# Patient Record
Sex: Female | Born: 1980 | Race: Asian | Hispanic: No | Marital: Married | Smoking: Never smoker
Health system: Southern US, Community
[De-identification: ages and names within clinical notes are randomized; demographics above are authoritative.]

## PROBLEM LIST (undated history)

## (undated) ENCOUNTER — Inpatient Hospital Stay (HOSPITAL_COMMUNITY): Payer: Self-pay

## (undated) DIAGNOSIS — Z789 Other specified health status: Secondary | ICD-10-CM

## (undated) DIAGNOSIS — D649 Anemia, unspecified: Secondary | ICD-10-CM

## (undated) HISTORY — PX: BREAST SURGERY: SHX581

## (undated) HISTORY — DX: Anemia, unspecified: D64.9

---

## 2014-04-14 ENCOUNTER — Other Ambulatory Visit (HOSPITAL_COMMUNITY): Payer: Self-pay | Admitting: Physician Assistant

## 2014-04-14 DIAGNOSIS — Z3689 Encounter for other specified antenatal screening: Secondary | ICD-10-CM

## 2014-04-14 LAB — OB RESULTS CONSOLE HEPATITIS B SURFACE ANTIGEN: Hepatitis B Surface Ag: NEGATIVE

## 2014-04-14 LAB — OB RESULTS CONSOLE ABO/RH: RH Type: POSITIVE

## 2014-04-14 LAB — OB RESULTS CONSOLE GC/CHLAMYDIA
Chlamydia: NEGATIVE
GC PROBE AMP, GENITAL: NEGATIVE

## 2014-04-14 LAB — OB RESULTS CONSOLE RPR: RPR: NONREACTIVE

## 2014-04-14 LAB — OB RESULTS CONSOLE HIV ANTIBODY (ROUTINE TESTING): HIV: NONREACTIVE

## 2014-04-14 LAB — OB RESULTS CONSOLE RUBELLA ANTIBODY, IGM: Rubella: IMMUNE

## 2014-04-14 LAB — OB RESULTS CONSOLE ANTIBODY SCREEN: Antibody Screen: NEGATIVE

## 2014-05-05 ENCOUNTER — Encounter (HOSPITAL_COMMUNITY): Payer: Self-pay

## 2014-05-05 ENCOUNTER — Ambulatory Visit (HOSPITAL_COMMUNITY)
Admission: RE | Admit: 2014-05-05 | Discharge: 2014-05-05 | Disposition: A | Discharge status: Home / Self Care | Payer: Self-pay | Source: Ambulatory Visit | Attending: Physician Assistant | Admitting: Physician Assistant

## 2014-05-05 DIAGNOSIS — Z3A18 18 weeks gestation of pregnancy: Secondary | ICD-10-CM | POA: Insufficient documentation

## 2014-05-05 DIAGNOSIS — Z36 Encounter for antenatal screening of mother: Secondary | ICD-10-CM | POA: Insufficient documentation

## 2014-05-05 DIAGNOSIS — Z3689 Encounter for other specified antenatal screening: Secondary | ICD-10-CM | POA: Insufficient documentation

## 2014-05-27 NOTE — L&D Delivery Note (Signed)
Delivery Note Pushed well with crowning shortly thereafter.  At 1:52 PM a viable and healthy female was delivered via Vaginal, Spontaneous Delivery (Presentation: ; Occiput Anterior).  APGAR: , ; weight  .pending   Placenta status:  Delayed, delivered by Dr Debroah LoopArnold via manual extraction. , .  Cord: 3 vessels  with the following complications: Retained placenta.    Anesthesia: Epidural  Episiotomy: None Lacerations: 3rd degree Suture Repair: 3.0 vicryl  Repair done under epidural anesthesia by Dr Delanna AhmadiArnold Est. Blood Loss (mL):  1500  Stage 3 Hemorrhage protocol initiated.  Patient had one reading of hypotension and tachycardia, improved with fluids. Dr Debroah LoopArnold states we did not need to call team.  Will transfuse 2 units of blood, draw CBC and place her on oxygen. Second IV started. Fluids administered.   Filed Vitals:   09/23/14 1100 09/23/14 1130 09/23/14 1200 09/23/14 1230  BP: 129/85 123/71 130/80 127/73  Pulse: 87 78 74 76  Temp:      TempSrc:      Resp:      Height:      Weight:        Mom to postpartum.  Baby to Couplet care / Skin to Skin.  Wynelle BourgeoisWILLIAMS,Jaretzi Droz 09/23/2014, 2:39 PM

## 2014-05-31 ENCOUNTER — Encounter (HOSPITAL_COMMUNITY): Payer: Self-pay | Admitting: *Deleted

## 2014-05-31 ENCOUNTER — Inpatient Hospital Stay (HOSPITAL_COMMUNITY): Payer: PRIVATE HEALTH INSURANCE

## 2014-05-31 ENCOUNTER — Inpatient Hospital Stay (HOSPITAL_COMMUNITY)
Admission: AD | Admit: 2014-05-31 | Discharge: 2014-05-31 | Disposition: A | Discharge status: Home / Self Care | Payer: PRIVATE HEALTH INSURANCE | Source: Ambulatory Visit | Attending: Obstetrics & Gynecology | Admitting: Obstetrics & Gynecology

## 2014-05-31 DIAGNOSIS — O4690 Antepartum hemorrhage, unspecified, unspecified trimester: Secondary | ICD-10-CM | POA: Diagnosis present

## 2014-05-31 DIAGNOSIS — N841 Polyp of cervix uteri: Secondary | ICD-10-CM | POA: Diagnosis present

## 2014-05-31 DIAGNOSIS — Z3A22 22 weeks gestation of pregnancy: Secondary | ICD-10-CM

## 2014-05-31 DIAGNOSIS — O4692 Antepartum hemorrhage, unspecified, second trimester: Secondary | ICD-10-CM

## 2014-05-31 DIAGNOSIS — O209 Hemorrhage in early pregnancy, unspecified: Secondary | ICD-10-CM | POA: Insufficient documentation

## 2014-05-31 DIAGNOSIS — O3441 Maternal care for other abnormalities of cervix, first trimester: Secondary | ICD-10-CM | POA: Insufficient documentation

## 2014-05-31 HISTORY — DX: Other specified health status: Z78.9

## 2014-05-31 LAB — URINE MICROSCOPIC-ADD ON

## 2014-05-31 LAB — URINALYSIS, ROUTINE W REFLEX MICROSCOPIC
BILIRUBIN URINE: NEGATIVE
GLUCOSE, UA: NEGATIVE mg/dL
KETONES UR: NEGATIVE mg/dL
Nitrite: NEGATIVE
PH: 7 (ref 5.0–8.0)
PROTEIN: NEGATIVE mg/dL
Specific Gravity, Urine: 1.015 (ref 1.005–1.030)
Urobilinogen, UA: 0.2 mg/dL (ref 0.0–1.0)

## 2014-05-31 LAB — WET PREP, GENITAL
CLUE CELLS WET PREP: NONE SEEN
Trich, Wet Prep: NONE SEEN
YEAST WET PREP: NONE SEEN

## 2014-05-31 NOTE — MAU Provider Note (Signed)
Obstetric Attending MAU Note  Chief Complaint:  Vaginal Bleeding   First Provider Initiated Contact with Patient 05/31/14 1346     HPI: Susan Lawrence is a 34 y.o. G1P0 at 595w4d who presents to maternity admissions reporting  Spotting for the past 5 days and cramping. No prior bleeding. Had normal anatomy scan and normal placenta.  Accompanied by husband. No recent IC. Denies contractions, leakage of fluid. Good fetal movement.   [facility] Course[facility] no [facility] this [facility]  Past Medical History  Diagnosis Date  . Medical history non-contributory     OB History  Gravida Para Term Preterm AB SAB TAB Ectopic Multiple Living  1             # Outcome Date GA Lbr Len/2nd Weight Sex Delivery Anes PTL Lv  1 Current               Past Surgical History  Procedure Laterality Date  . Breast surgery      Family History: History reviewed. No pertinent family history.  Social History: History  Substance Use Topics  . Smoking status: Never Smoker   . Smokeless tobacco: Not on file  . Alcohol Use: No    Allergies: No Known Allergies  No prescriptions prior to admission    ROS: Pertinent findings in history of present illness.  Physical Exam  Blood pressure 110/59, pulse 82, temperature 98.3 F (36.8 C), temperature source Oral, resp. rate 18. GENERAL: Well-developed, well-nourished female in no acute distress.  HEENT: Normocephalic, atraumatic ABDOMEN: Soft, non-tender, gravid appropriate for gestational age EXTREMITIES: Nontender, no edema NEURO: Alert and oriented SPECULUM EXAM: NEFG, physiologic discharge, no blood. 6 cm x 7 mm long pink polyp noted coming from cervical os with long and narrow stalk, unable to ascertain level of origin of polyp. Polyp base noted to be friable and is around introitus with Valsalva.  Labs: Results for orders placed or performed during the hospital encounter of 05/31/14 (from the past 24 hour(s))    Urinalysis, Routine w reflex microscopic     Status: Abnormal   Collection Time: 05/31/14 11:28 AM  Result Value Ref Range   Color, Urine YELLOW YELLOW   APPearance CLEAR CLEAR   Specific Gravity, Urine 1.015 1.005 - 1.030   pH 7.0 5.0 - 8.0   Glucose, UA NEGATIVE NEGATIVE mg/dL   Hgb urine dipstick SMALL (A) NEGATIVE   Bilirubin Urine NEGATIVE NEGATIVE   Ketones, ur NEGATIVE NEGATIVE mg/dL   Protein, ur NEGATIVE NEGATIVE mg/dL   Urobilinogen, UA 0.2 0.0 - 1.0 mg/dL   Nitrite NEGATIVE NEGATIVE   Leukocytes, UA LARGE (A) NEGATIVE  Urine microscopic-add on     Status: Abnormal   Collection Time: 05/31/14 11:28 AM  Result Value Ref Range   Squamous Epithelial / LPF MANY (A) RARE   WBC, UA TOO NUMEROUS TO COUNT <3 WBC/hpf   RBC / HPF 3-6 <3 RBC/hpf   Bacteria, UA RARE RARE   Urine-Other MUCOUS PRESENT   Wet prep, genital     Status: Abnormal   Collection Time: 05/31/14  4:57 PM  Result Value Ref Range   Yeast Wet Prep HPF POC NONE SEEN NONE SEEN   Trich, Wet Prep NONE SEEN NONE SEEN   Clue Cells Wet Prep HPF POC NONE SEEN NONE SEEN   WBC, Wet Prep HPF POC FEW (A) NONE SEEN    Imaging:    MAU Course:   Assessment: 1. Polyp at cervical os  2. Vaginal bleeding in pregnancy, second trimester     Plan: Discharge home Patient told that spotting may continue due to polyp but bleeding precautions revewed in detail.  No indication for pelvic rest but intercourse can cause bleeding from the polyp. No indication for polypectomy at this GA; also has increased risk of bleeding. Preterm labor precautions and fetal kick counts reviewed Follow up with OB provider as scheduled on 06/09/14.     Medication List    ASK your doctor about these medications        PRENATAL/FOLIC ACID PO  Take 2 tablets by mouth daily.        Tereso Newcomer, MD 05/31/2014 6:28 PM

## 2014-05-31 NOTE — MAU Note (Signed)
Stared spotting 5 days ago, heavier today.  Bright red.  Cramping in lower abd started today.   No prior bleeding in  preg.

## 2014-05-31 NOTE — MAU Note (Signed)
Cervical length 3.6 per U/S tech.

## 2014-05-31 NOTE — Discharge Instructions (Signed)
Cervical polyp during pregnancy  Another common cause of spotting is a cervical polyp (a harmless growth on the cervix), which is more likely to bleed during pregnancy due to higher estrogen levels. This may occur because there are an increased number of blood vessels in the tissue around the cervix during pregnancy.  As a result, contact with this area (through sexual intercourse or a gynecological exam, for example) can cause bleeding.

## 2014-06-01 LAB — GC/CHLAMYDIA PROBE AMP
CT Probe RNA: NEGATIVE
GC Probe RNA: NEGATIVE

## 2014-09-05 LAB — OB RESULTS CONSOLE GC/CHLAMYDIA
CHLAMYDIA, DNA PROBE: NEGATIVE
Gonorrhea: NEGATIVE

## 2014-09-05 LAB — OB RESULTS CONSOLE GBS: GBS: NEGATIVE

## 2014-09-06 ENCOUNTER — Encounter (HOSPITAL_COMMUNITY): Payer: Self-pay | Admitting: *Deleted

## 2014-09-06 ENCOUNTER — Inpatient Hospital Stay (HOSPITAL_COMMUNITY)
Admission: EM | Admit: 2014-09-06 | Discharge: 2014-09-06 | Disposition: A | Discharge status: Home / Self Care | Payer: PRIVATE HEALTH INSURANCE | Source: Ambulatory Visit | Attending: Family Medicine | Admitting: Family Medicine

## 2014-09-06 DIAGNOSIS — M545 Low back pain: Secondary | ICD-10-CM | POA: Insufficient documentation

## 2014-09-06 DIAGNOSIS — O4703 False labor before 37 completed weeks of gestation, third trimester: Secondary | ICD-10-CM | POA: Diagnosis not present

## 2014-09-06 DIAGNOSIS — R109 Unspecified abdominal pain: Secondary | ICD-10-CM | POA: Insufficient documentation

## 2014-09-06 DIAGNOSIS — Z3A36 36 weeks gestation of pregnancy: Secondary | ICD-10-CM | POA: Diagnosis not present

## 2014-09-06 DIAGNOSIS — O479 False labor, unspecified: Secondary | ICD-10-CM

## 2014-09-06 LAB — GROUP B STREP BY PCR: Group B strep by PCR: NEGATIVE

## 2014-09-06 NOTE — MAU Note (Signed)
Pain started last night, getting stronger. No bleeding or leaking

## 2014-09-06 NOTE — MAU Provider Note (Signed)
History     CSN: [id number]  Arrival date and time: 09/06/14 1027   First Provider Initiated Contact with Patient 09/06/14 1123      Chief Complaint  Patient presents with  . Labor Eval   HPI   Patient is 34 y.o. G1P0 1261w4d here with complaints of low back pain/ abdominal pain. Patient reports that last night she was in so much pain in her back that she could not sleep.  She noticed that this morning the pain was worse and now wraps around underneath her belly.  Has used salonpas for back pain with no relief.  Has not taken any medications for the back pain.  Endorses loss of fluid though she unsure if urine vs ROM.  +FM, denies VB, vaginal discharge.  OB History    Gravida Para Term Preterm AB TAB SAB Ectopic Multiple Living   1               Past Medical History  Diagnosis Date  . Medical history non-contributory     Past Surgical History  Procedure Laterality Date  . Breast surgery      No family history on file.  History  Substance Use Topics  . Smoking status: Never Smoker   . Smokeless tobacco: Not on file  . Alcohol Use: No    Allergies: No Known Allergies  Prescriptions prior to admission  Medication Sig Dispense Refill Last Dose  . Prenatal Vit-Fe Fumarate-FA (PRENATAL/FOLIC ACID PO) Take 2 tablets by mouth daily.   09/06/2014 at Unknown time    ROS Physical Exam   Blood pressure 125/69, pulse 71, temperature 98 F (36.7 C), temperature source Oral, resp. rate 18, height 5\' 1"  (1.549 m), weight 138 lb (62.596 kg).  Physical Exam  Constitutional: She is oriented to person, place, and time. She appears well-developed and well-nourished. No distress.  HENT:  Head: Normocephalic and atraumatic.  Eyes: EOM are normal. No scleral icterus.  Neck: Normal range of motion. Neck supple.  Cardiovascular: Normal rate, regular rhythm, normal heart sounds and intact distal pulses.   No murmur heard. Respiratory: Effort normal and breath sounds normal. No  respiratory distress.  GI: Bowel sounds are normal. There is no tenderness.  Gravid  Musculoskeletal: Normal range of motion. She exhibits no edema.  Neurological: She is alert and oriented to person, place, and time.  Skin: Skin is warm and dry. No rash noted.  Psychiatric: She has a normal mood and affect. Her behavior is normal.   Dilation: 1.5 Effacement (%): 50 Presentation: Vertex Exam by:: dr Loreta Aveacosta  Fetal monitoringBaseline: 149 bpm and Variability: Good {> 6 bpm) Uterine activity uterine irritation w/ irregular contraction  MAU Course  Procedures  MDM  NST reactive.    Assessment and Plan  Early labor.  Encouraged PO hydration.  Return precautions.  Follow up with OB as scheduled.  Delynn FlavinGottschalk, Ashly M, DO 09/06/2014, 1:38 PM   OB fellow attestation:  I have seen and examined this patient; I agree with above documentation in the resident's note.   Lavera GuiseLizelda Tomasini is a 34 y.o. G1P0 reporting for labor evaluation +FM, denies LOF, VB, contractions, vaginal discharge.  PE: BP 125/69 mmHg  Pulse 71  Temp(Src) 98 F (36.7 C) (Oral)  Resp 18  Ht 5\' 1"  (1.549 m)  Wt 138 lb (62.596 kg)  BMI 26.09 kg/m2 Gen: calm comfortable, NAD Resp: normal effort, no distress Abd: gravid  ROS, labs, PMH reviewed NST reactive My exam: 1.5/50/-3  Dilation:  2.5 Effacement (%): 60 Station: -3 Presentation: Vertex Exam by:: soliz rn    Plan: - fetal kick counts reinforced, patient clearly in early latent labor but not active.  Will discharge with likely readmission - GBS pcr negative - discussed cervical exam with RN who did follow up, reported exam was 2 finger breadths, on my exam was right 2 finger breadths.  Therefore discharged despite some cervical change given G1P0.  Perry Mount, MD 3:09 PM

## 2014-09-06 NOTE — Discharge Instructions (Signed)
Braxton Hicks Contractions °Contractions of the uterus can occur throughout pregnancy. Contractions are not always a sign that you are in labor.  °WHAT ARE BRAXTON HICKS CONTRACTIONS?  °Contractions that occur before labor are called Braxton Hicks contractions, or false labor. Toward the end of pregnancy (32-34 weeks), these contractions can develop more often and may become more forceful. This is not true labor because these contractions do not result in opening (dilatation) and thinning of the cervix. They are sometimes difficult to tell apart from true labor because these contractions can be forceful and people have different pain tolerances. You should not feel embarrassed if you go to the hospital with false labor. Sometimes, the only way to tell if you are in true labor is for your health care provider to look for changes in the cervix. °If there are no prenatal problems or other health problems associated with the pregnancy, it is completely safe to be sent home with false labor and await the onset of true labor. °HOW CAN YOU TELL THE DIFFERENCE BETWEEN TRUE AND FALSE LABOR? °False Labor °· The contractions of false labor are usually shorter and not as hard as those of true labor.   °· The contractions are usually irregular.   °· The contractions are often felt in the front of the lower abdomen and in the groin.   °· The contractions may go away when you walk around or change positions while lying down.   °· The contractions get weaker and are shorter lasting as time goes on.   °· The contractions do not usually become progressively stronger, regular, and closer together as with true labor.   °True Labor °· Contractions in true labor last 30-70 seconds, become very regular, usually become more intense, and increase in frequency.   °· The contractions do not go away with walking.   °· The discomfort is usually felt in the top of the uterus and spreads to the lower abdomen and low back.   °· True labor can be  determined by your health care provider with an exam. This will show that the cervix is dilating and getting thinner.   °WHAT TO REMEMBER °· Keep up with your usual exercises and follow other instructions given by your health care provider.   °· Take medicines as directed by your health care provider.   °· Keep your regular prenatal appointments.   °· Eat and drink lightly if you think you are going into labor.   °· If Braxton Hicks contractions are making you uncomfortable:   °¨ Change your position from lying down or resting to walking, or from walking to resting.   °¨ Sit and rest in a tub of warm water.   °¨ Drink 2-3 glasses of water. Dehydration may cause these contractions.   °¨ Do slow and deep breathing several times an hour.   °WHEN SHOULD I SEEK IMMEDIATE MEDICAL CARE? °Seek immediate medical care if: °· Your contractions become stronger, more regular, and closer together.   °· You have fluid leaking or gushing from your vagina.   °· You have a fever.   °· You pass blood-tinged mucus.   °· You have vaginal bleeding.   °· You have continuous abdominal pain.   °· You have low back pain that you never had before.   °· You feel your baby's head pushing down and causing pelvic pressure.   °· Your baby is not moving as much as it used to.   °Document Released: 05/13/2005 Document Revised: 05/18/2013 Document Reviewed: 02/22/2013 °[employer]® Patient Information ©2015 [employer]. This information is not intended to replace advice given to you by your health care   provider. Make sure you discuss any questions you have with your health care provider. ° °

## 2014-09-13 ENCOUNTER — Other Ambulatory Visit (HOSPITAL_COMMUNITY): Payer: Self-pay | Admitting: Urology

## 2014-09-13 DIAGNOSIS — Z3A37 37 weeks gestation of pregnancy: Secondary | ICD-10-CM

## 2014-09-13 DIAGNOSIS — O163 Unspecified maternal hypertension, third trimester: Secondary | ICD-10-CM

## 2014-09-13 DIAGNOSIS — R778 Other specified abnormalities of plasma proteins: Secondary | ICD-10-CM

## 2014-09-14 ENCOUNTER — Encounter (HOSPITAL_COMMUNITY): Payer: Self-pay

## 2014-09-14 ENCOUNTER — Ambulatory Visit (HOSPITAL_COMMUNITY)
Admission: RE | Admit: 2014-09-14 | Discharge: 2014-09-14 | Disposition: A | Discharge status: Home / Self Care | Payer: PRIVATE HEALTH INSURANCE | Source: Ambulatory Visit | Attending: Physician Assistant | Admitting: Physician Assistant

## 2014-09-14 DIAGNOSIS — Z3A34 34 weeks gestation of pregnancy: Secondary | ICD-10-CM | POA: Diagnosis not present

## 2014-09-14 DIAGNOSIS — O133 Gestational [pregnancy-induced] hypertension without significant proteinuria, third trimester: Secondary | ICD-10-CM | POA: Insufficient documentation

## 2014-09-14 DIAGNOSIS — O09513 Supervision of elderly primigravida, third trimester: Secondary | ICD-10-CM | POA: Insufficient documentation

## 2014-09-14 DIAGNOSIS — R778 Other specified abnormalities of plasma proteins: Secondary | ICD-10-CM

## 2014-09-14 DIAGNOSIS — O163 Unspecified maternal hypertension, third trimester: Secondary | ICD-10-CM

## 2014-09-14 DIAGNOSIS — Z3A37 37 weeks gestation of pregnancy: Secondary | ICD-10-CM

## 2014-09-19 ENCOUNTER — Encounter (HOSPITAL_COMMUNITY): Payer: Self-pay | Admitting: *Deleted

## 2014-09-19 ENCOUNTER — Telehealth (HOSPITAL_COMMUNITY): Payer: Self-pay | Admitting: *Deleted

## 2014-09-19 ENCOUNTER — Other Ambulatory Visit (HOSPITAL_COMMUNITY): Payer: Self-pay | Admitting: Urology

## 2014-09-19 NOTE — Telephone Encounter (Signed)
Preadmission screen  

## 2014-09-20 ENCOUNTER — Other Ambulatory Visit: Payer: Self-pay | Admitting: Family Medicine

## 2014-09-20 DIAGNOSIS — Z9289 Personal history of other medical treatment: Secondary | ICD-10-CM

## 2014-09-20 DIAGNOSIS — O163 Unspecified maternal hypertension, third trimester: Secondary | ICD-10-CM

## 2014-09-20 DIAGNOSIS — R778 Other specified abnormalities of plasma proteins: Secondary | ICD-10-CM

## 2014-09-21 ENCOUNTER — Ambulatory Visit (HOSPITAL_COMMUNITY)
Admission: RE | Admit: 2014-09-21 | Discharge: 2014-09-21 | Disposition: A | Discharge status: Home / Self Care | Payer: PRIVATE HEALTH INSURANCE | Source: Ambulatory Visit | Attending: Physician Assistant | Admitting: Physician Assistant

## 2014-09-21 ENCOUNTER — Encounter (HOSPITAL_COMMUNITY): Payer: Self-pay

## 2014-09-21 DIAGNOSIS — N841 Polyp of cervix uteri: Secondary | ICD-10-CM | POA: Insufficient documentation

## 2014-09-21 DIAGNOSIS — R778 Other specified abnormalities of plasma proteins: Secondary | ICD-10-CM

## 2014-09-21 DIAGNOSIS — Z3A38 38 weeks gestation of pregnancy: Secondary | ICD-10-CM | POA: Insufficient documentation

## 2014-09-21 DIAGNOSIS — Z9289 Personal history of other medical treatment: Secondary | ICD-10-CM

## 2014-09-21 DIAGNOSIS — O3443 Maternal care for other abnormalities of cervix, third trimester: Secondary | ICD-10-CM | POA: Diagnosis not present

## 2014-09-21 DIAGNOSIS — Z36 Encounter for antenatal screening of mother: Secondary | ICD-10-CM | POA: Diagnosis not present

## 2014-09-21 DIAGNOSIS — O09513 Supervision of elderly primigravida, third trimester: Secondary | ICD-10-CM | POA: Insufficient documentation

## 2014-09-21 DIAGNOSIS — O163 Unspecified maternal hypertension, third trimester: Secondary | ICD-10-CM

## 2014-09-22 ENCOUNTER — Encounter (HOSPITAL_COMMUNITY): Payer: Self-pay | Admitting: *Deleted

## 2014-09-22 ENCOUNTER — Encounter (HOSPITAL_COMMUNITY): Payer: Self-pay

## 2014-09-22 ENCOUNTER — Inpatient Hospital Stay (HOSPITAL_COMMUNITY): Payer: No Typology Code available for payment source | Admitting: Anesthesiology

## 2014-09-22 ENCOUNTER — Inpatient Hospital Stay (HOSPITAL_COMMUNITY)
Admission: AD | Admit: 2014-09-22 | Discharge: 2014-09-22 | Disposition: A | Discharge status: Home / Self Care | Payer: No Typology Code available for payment source | Source: Ambulatory Visit | Attending: Obstetrics & Gynecology | Admitting: Obstetrics & Gynecology

## 2014-09-22 ENCOUNTER — Inpatient Hospital Stay (HOSPITAL_COMMUNITY)
Admission: RE | Admit: 2014-09-22 | Discharge: 2014-09-25 | DRG: 767 | Disposition: A | Discharge status: Home / Self Care | Payer: No Typology Code available for payment source | Source: Ambulatory Visit | Attending: Obstetrics and Gynecology | Admitting: Obstetrics and Gynecology

## 2014-09-22 DIAGNOSIS — R109 Unspecified abdominal pain: Secondary | ICD-10-CM

## 2014-09-22 DIAGNOSIS — O09513 Supervision of elderly primigravida, third trimester: Secondary | ICD-10-CM | POA: Diagnosis not present

## 2014-09-22 DIAGNOSIS — Z3A39 39 weeks gestation of pregnancy: Secondary | ICD-10-CM | POA: Insufficient documentation

## 2014-09-22 DIAGNOSIS — O1213 Gestational proteinuria, third trimester: Secondary | ICD-10-CM | POA: Diagnosis present

## 2014-09-22 DIAGNOSIS — Z3A38 38 weeks gestation of pregnancy: Secondary | ICD-10-CM | POA: Diagnosis present

## 2014-09-22 LAB — COMPREHENSIVE METABOLIC PANEL
ALK PHOS: 84 U/L (ref 39–117)
ALT: 28 U/L (ref 0–35)
AST: 41 U/L — ABNORMAL HIGH (ref 0–37)
Albumin: 3 g/dL — ABNORMAL LOW (ref 3.5–5.2)
Anion gap: 7 (ref 5–15)
BILIRUBIN TOTAL: 0.3 mg/dL (ref 0.3–1.2)
BUN: 11 mg/dL (ref 6–23)
CALCIUM: 8.9 mg/dL (ref 8.4–10.5)
CO2: 20 mmol/L (ref 19–32)
CREATININE: 0.76 mg/dL (ref 0.50–1.10)
Chloride: 107 mmol/L (ref 96–112)
GLUCOSE: 75 mg/dL (ref 70–99)
Potassium: 4.2 mmol/L (ref 3.5–5.1)
Sodium: 134 mmol/L — ABNORMAL LOW (ref 135–145)
TOTAL PROTEIN: 6.3 g/dL (ref 6.0–8.3)

## 2014-09-22 LAB — CBC
HEMATOCRIT: 35.1 % — AB (ref 36.0–46.0)
Hemoglobin: 11.9 g/dL — ABNORMAL LOW (ref 12.0–15.0)
MCH: 29 pg (ref 26.0–34.0)
MCHC: 33.9 g/dL (ref 30.0–36.0)
MCV: 85.6 fL (ref 78.0–100.0)
PLATELETS: 203 10*3/uL (ref 150–400)
RBC: 4.1 MIL/uL (ref 3.87–5.11)
RDW: 13.9 % (ref 11.5–15.5)
WBC: 13.1 10*3/uL — ABNORMAL HIGH (ref 4.0–10.5)

## 2014-09-22 LAB — ABO/RH: ABO/RH(D): A POS

## 2014-09-22 MED ORDER — LIDOCAINE HCL (PF) 1 % IJ SOLN
INTRAMUSCULAR | Status: DC | PRN
  Administered 2014-09-22 (×2): 4 mL

## 2014-09-22 MED ORDER — MISOPROSTOL 50MCG HALF TABLET
50.0000 ug | ORAL_TABLET | ORAL | Status: AC
  Administered 2014-09-22 – 2014-09-23 (×2): 50 ug via ORAL
  Filled 2014-09-22 (×2): qty 1

## 2014-09-22 MED ORDER — EPHEDRINE 5 MG/ML INJ
10.0000 mg | INTRAVENOUS | Status: DC | PRN
  Filled 2014-09-22: qty 2

## 2014-09-22 MED ORDER — FLEET ENEMA 7-19 GM/118ML RE ENEM: 1.0000 | ENEMA | RECTAL | Status: DC | PRN

## 2014-09-22 MED ORDER — CITRIC ACID-SODIUM CITRATE 334-500 MG/5ML PO SOLN: 30.0000 mL | ORAL | Status: DC | PRN

## 2014-09-22 MED ORDER — OXYTOCIN 40 UNITS IN LACTATED RINGERS INFUSION - SIMPLE MED
1.0000 m[IU]/min | INTRAVENOUS | Status: DC
  Filled 2014-09-22: qty 1000

## 2014-09-22 MED ORDER — PHENYLEPHRINE 40 MCG/ML (10ML) SYRINGE FOR IV PUSH (FOR BLOOD PRESSURE SUPPORT)
80.0000 ug | PREFILLED_SYRINGE | INTRAVENOUS | Status: DC | PRN
  Filled 2014-09-22: qty 2
  Filled 2014-09-22: qty 20

## 2014-09-22 MED ORDER — ACETAMINOPHEN 325 MG PO TABS: 650.0000 mg | ORAL_TABLET | ORAL | Status: DC | PRN

## 2014-09-22 MED ORDER — OXYTOCIN BOLUS FROM INFUSION: 500.0000 mL | INTRAVENOUS | Status: DC

## 2014-09-22 MED ORDER — OXYCODONE-ACETAMINOPHEN 5-325 MG PO TABS: 1.0000 | ORAL_TABLET | ORAL | Status: DC | PRN

## 2014-09-22 MED ORDER — LACTATED RINGERS IV SOLN
INTRAVENOUS | Status: DC
  Administered 2014-09-22 – 2014-09-23 (×3): via INTRAVENOUS

## 2014-09-22 MED ORDER — LIDOCAINE HCL (PF) 1 % IJ SOLN
30.0000 mL | INTRAMUSCULAR | Status: DC | PRN
  Filled 2014-09-22: qty 30

## 2014-09-22 MED ORDER — PHENYLEPHRINE 40 MCG/ML (10ML) SYRINGE FOR IV PUSH (FOR BLOOD PRESSURE SUPPORT)
80.0000 ug | PREFILLED_SYRINGE | INTRAVENOUS | Status: DC | PRN
  Filled 2014-09-22: qty 2

## 2014-09-22 MED ORDER — LACTATED RINGERS IV SOLN
500.0000 mL | INTRAVENOUS | Status: DC | PRN
  Administered 2014-09-23: 500 mL via INTRAVENOUS

## 2014-09-22 MED ORDER — OXYTOCIN 40 UNITS IN LACTATED RINGERS INFUSION - SIMPLE MED
62.5000 mL/h | INTRAVENOUS | Status: DC
  Filled 2014-09-22: qty 1000

## 2014-09-22 MED ORDER — OXYTOCIN 40 UNITS IN LACTATED RINGERS INFUSION - SIMPLE MED: 1.0000 m[IU]/min | INTRAVENOUS | Status: DC

## 2014-09-22 MED ORDER — DIPHENHYDRAMINE HCL 50 MG/ML IJ SOLN: 12.5000 mg | INTRAMUSCULAR | Status: DC | PRN

## 2014-09-22 MED ORDER — OXYCODONE-ACETAMINOPHEN 5-325 MG PO TABS: 2.0000 | ORAL_TABLET | ORAL | Status: DC | PRN

## 2014-09-22 MED ORDER — FENTANYL CITRATE (PF) 100 MCG/2ML IJ SOLN: 50.0000 ug | INTRAMUSCULAR | Status: DC | PRN

## 2014-09-22 MED ORDER — TERBUTALINE SULFATE 1 MG/ML IJ SOLN: 0.2500 mg | Freq: Once | INTRAMUSCULAR | Status: AC | PRN

## 2014-09-22 MED ORDER — FENTANYL 2.5 MCG/ML BUPIVACAINE 1/10 % EPIDURAL INFUSION (WH - ANES)
14.0000 mL/h | INTRAMUSCULAR | Status: DC | PRN
  Administered 2014-09-22 – 2014-09-23 (×3): 14 mL/h via EPIDURAL
  Filled 2014-09-22 (×3): qty 125

## 2014-09-22 MED ORDER — LACTATED RINGERS IV SOLN
500.0000 mL | Freq: Once | INTRAVENOUS | Status: AC
  Administered 2014-09-22: 500 mL via INTRAVENOUS

## 2014-09-22 MED ORDER — ONDANSETRON HCL 4 MG/2ML IJ SOLN: 4.0000 mg | Freq: Four times a day (QID) | INTRAMUSCULAR | Status: DC | PRN

## 2014-09-22 NOTE — Anesthesia Preprocedure Evaluation (Signed)
Anesthesia Evaluation  Patient identified by MRN, date of birth, ID band Patient awake    Reviewed: Allergy & Precautions, NPO status , Patient's Chart, lab work & pertinent test results  History of Anesthesia Complications Negative for: history of anesthetic complications  Airway Mallampati: II  TM Distance: >3 FB Neck ROM: Full    Dental no notable dental hx. (+) Dental Advisory Given   Pulmonary neg pulmonary ROS,  breath sounds clear to auscultation  Pulmonary exam normal       Cardiovascular negative cardio ROS  Rhythm:Regular Rate:Normal     Neuro/Psych negative neurological ROS  negative psych ROS   GI/Hepatic negative GI ROS, Neg liver ROS,   Endo/Other  negative endocrine ROS  Renal/GU negative Renal ROS  negative genitourinary   Musculoskeletal negative musculoskeletal ROS (+)   Abdominal   Peds negative pediatric ROS (+)  Hematology  (+) anemia ,   Anesthesia Other Findings   Reproductive/Obstetrics (+) Pregnancy                             Anesthesia Physical Anesthesia Plan  ASA: II  Anesthesia Plan: Epidural   Post-op Pain Management:    Induction:   Airway Management Planned:   Additional Equipment:   Intra-op Plan:   Post-operative Plan:   Informed Consent: I have reviewed the patients History and Physical, chart, labs and discussed the procedure including the risks, benefits and alternatives for the proposed anesthesia with the patient or authorized representative who has indicated his/her understanding and acceptance.   Dental advisory given  Plan Discussed with: CRNA  Anesthesia Plan Comments:         Anesthesia Quick Evaluation

## 2014-09-22 NOTE — Anesthesia Procedure Notes (Signed)
Epidural Patient location during procedure: OB Start time: 09/22/2014 10:00 PM  Staffing Anesthesiologist: Karie SchwalbeJUDD, Atiyana Welte Performed by: anesthesiologist   Preanesthetic Checklist Completed: patient identified, site marked, surgical consent, pre-op evaluation, timeout performed, IV checked, risks and benefits discussed and monitors and equipment checked  Epidural Patient position: sitting Prep: site prepped and draped and DuraPrep Patient monitoring: continuous pulse ox and blood pressure Approach: midline Location: L3-L4 Injection technique: LOR saline  Needle:  Needle type: Tuohy  Needle gauge: 17 G Needle length: 9 cm and 9 Needle insertion depth: 4 cm Catheter type: closed end flexible Catheter size: 19 Gauge Catheter at skin depth: 9 cm Test dose: negative  Assessment Events: blood not aspirated, injection not painful, no injection resistance, negative IV test and no paresthesia  Additional Notes Patient identified. Risks/Benefits/Options discussed with patient including but not limited to bleeding, infection, nerve damage, paralysis, failed block, incomplete pain control, headache, blood pressure changes, nausea, vomiting, reactions to medication both or allergic, itching and postpartum back pain. Confirmed with bedside nurse the patient's most recent platelet count. Confirmed with patient that they are not currently taking any anticoagulation, have any bleeding history or any family history of bleeding disorders. Patient expressed understanding and wished to proceed. All questions were answered. Sterile technique was used throughout the entire procedure. Please see nursing notes for vital signs. Test dose was given through epidural catheter and negative prior to continuing to dose epidural or start infusion. Warning signs of high block given to the patient including shortness of breath, tingling/numbness in hands, complete motor block, or any concerning symptoms with instructions to  call for help. Patient was given instructions on fall risk and not to get out of bed. All questions and concerns addressed with instructions to call with any issues or inadequate analgesia.

## 2014-09-22 NOTE — Discharge Instructions (Signed)
Braxton Hicks Contractions °Contractions of the uterus can occur throughout pregnancy. Contractions are not always a sign that you are in labor.  °WHAT ARE BRAXTON HICKS CONTRACTIONS?  °Contractions that occur before labor are called Braxton Hicks contractions, or false labor. Toward the end of pregnancy (32-34 weeks), these contractions can develop more often and may become more forceful. This is not true labor because these contractions do not result in opening (dilatation) and thinning of the cervix. They are sometimes difficult to tell apart from true labor because these contractions can be forceful and people have different pain tolerances. You should not feel embarrassed if you go to the hospital with false labor. Sometimes, the only way to tell if you are in true labor is for your health care provider to look for changes in the cervix. °If there are no prenatal problems or other health problems associated with the pregnancy, it is completely safe to be sent home with false labor and await the onset of true labor. °HOW CAN YOU TELL THE DIFFERENCE BETWEEN TRUE AND FALSE LABOR? °False Labor °· The contractions of false labor are usually shorter and not as hard as those of true labor.   °· The contractions are usually irregular.   °· The contractions are often felt in the front of the lower abdomen and in the groin.   °· The contractions may go away when you walk around or change positions while lying down.   °· The contractions get weaker and are shorter lasting as time goes on.   °· The contractions do not usually become progressively stronger, regular, and closer together as with true labor.   °True Labor °· Contractions in true labor last 30-70 seconds, become very regular, usually become more intense, and increase in frequency.   °· The contractions do not go away with walking.   °· The discomfort is usually felt in the top of the uterus and spreads to the lower abdomen and low back.   °· True labor can be  determined by your health care provider with an exam. This will show that the cervix is dilating and getting thinner.   °WHAT TO REMEMBER °· Keep up with your usual exercises and follow other instructions given by your health care provider.   °· Take medicines as directed by your health care provider.   °· Keep your regular prenatal appointments.   °· Eat and drink lightly if you think you are going into labor.   °· If Braxton Hicks contractions are making you uncomfortable:   °¨ Change your position from lying down or resting to walking, or from walking to resting.   °¨ Sit and rest in a tub of warm water.   °¨ Drink 2-3 glasses of water. Dehydration may cause these contractions.   °¨ Do slow and deep breathing several times an hour.   °WHEN SHOULD I SEEK IMMEDIATE MEDICAL CARE? °Seek immediate medical care if: °· Your contractions become stronger, more regular, and closer together.   °· You have fluid leaking or gushing from your vagina.   °· You have a fever.   °· You pass blood-tinged mucus.   °· You have vaginal bleeding.   °· You have continuous abdominal pain.   °· You have low back pain that you never had before.   °· You feel your baby's head pushing down and causing pelvic pressure.   °· Your baby is not moving as much as it used to.   °Document Released: 05/13/2005 Document Revised: 05/18/2013 Document Reviewed: 02/22/2013 °[employer]® Patient Information ©2015 [employer]. This information is not intended to replace advice given to you by your health care   provider. Make sure you discuss any questions you have with your health care provider. ° °

## 2014-09-22 NOTE — H&P (Signed)
Susan Lawrence is a 34 y.o. female G1P0 with IUP at 9469w6d presenting for IOL per Dr. Vincenza Lawrence (MFM) for PROTEINURIA. Marland Kitchen.  Prenatal History/Complications:  Past Medical History: Past Medical History  Diagnosis Date  . Medical history non-contributory   . Anemia     Past Surgical History: Past Surgical History  Procedure Laterality Date  . Breast surgery      Obstetrical History: OB History    Gravida Para Term Preterm AB TAB SAB Ectopic Multiple Living   1               Gynecological History:   Social History: History   Social History  . Marital Status: Married    Spouse Name: N/A  . Number of Children: N/A  . Years of Education: N/A   Social History Main Topics  . Smoking status: Never Smoker   . Smokeless tobacco: Never Used  . Alcohol Use: No  . Drug Use: No  . Sexual Activity: Yes   Other Topics Concern  . None   Social History Narrative    Family History: History reviewed. No pertinent family history.  Allergies: No Known Allergies  Prescriptions prior to admission  Medication Sig Dispense Refill Last Dose  . Prenatal Vit-Fe Fumarate-FA (PRENATAL/FOLIC ACID PO) Take 2 tablets by mouth daily.   Taking     Prenatal Transfer Tool  Maternal Diabetes: No Genetic Screening: Normal Maternal Ultrasounds/Referrals: Normal except proteinuria Fetal Ultrasounds or other Referrals:  None Maternal Substance Abuse:  No Significant Maternal Medications:  None Significant Maternal Lab Results: Lab values include: Group B Strep negative     Review of Systems   Constitutional: Negative for fever and chills Eyes: Negative for visual disturbances Respiratory: Negative for shortness of breath, dyspnea Cardiovascular: Negative for chest pain or palpitations  Gastrointestinal: Negative for vomiting, diarrhea and constipation.  POSITIVE for abdominal pain (contractions) Genitourinary: Negative for dysuria and urgency Musculoskeletal: Negative for back pain,  joint pain, myalgias  Neurological: Negative for dizziness and headaches      Blood pressure 126/75, pulse 69, temperature 98 F (36.7 C), temperature source Oral, resp. rate 18, height 5\' 2"  (1.575 m), weight 63.05 kg (139 lb), last menstrual period 12/24/2013. General appearance: alert, cooperative and no distress Lungs: clear to auscultation bilaterally Heart: regular rate and rhythm Abdomen: soft, non-tender; bowel sounds normal Extremities: Homans sign is negative, no sign of DVT DTR's 2+ Presentation: cephalic Fetal monitoring  Baseline: 140 bpm, Variability: Good {> 6 bpm), Accelerations: Reactive and Decelerations: Absent Uterine activity  Irregular and mild CX moderate to firm consistency Dilation: 2.5 Effacement (%): 50 Station: -1 Exam by:: Susan Lawrence CNM   Prenatal labs: ABO, Rh: A/Positive/-- (11/19 0000) Antibody: Negative (11/19 0000) Rubella:  immune RPR: Nonreactive (11/19 0000)  HBsAg: Negative (11/19 0000)  HIV: Non-reactive (11/19 0000)  GBS: Negative (04/11 0000)  1 hr Glucola 95 Genetic screening  normal Anatomy US normal   Results for orders placed or performed during the hospital encounter of 09/22/14 (from the past 24 hour(s))  CBC   Collection Time: 09/22/14  8:10 PM  Result Value Ref Range   WBC 13.1 (H) 4.0 - 10.5 K/uL   RBC 4.10 3.87 - 5.11 MIL/uL   Hemoglobin 11.9 (L) 12.0 - 15.0 g/dL   HCT 69.635.1 (L) 29.536.0 - 28.446.0 %   MCV 85.6 78.0 - 100.0 fL   MCH 29.0 26.0 - 34.0 pg   MCHC 33.9 30.0 - 36.0 g/dL   RDW 13.213.9 44.011.5 -  15.5 %   Platelets 203 150 - 400 K/uL  Comprehensive metabolic panel   Collection Time: 09/22/14  8:10 PM  Result Value Ref Range   Sodium 134 (L) 135 - 145 mmol/L   Potassium 4.2 3.5 - 5.1 mmol/L   Chloride 107 96 - 112 mmol/L   CO2 20 19 - 32 mmol/L   Glucose, Bld 75 70 - 99 mg/dL   BUN 11 6 - 23 mg/dL   Creatinine, Ser 9.14 0.50 - 1.10 mg/dL   Calcium 8.9 8.4 - 78.2 mg/dL   Total Protein 6.3 6.0 - 8.3  g/dL   Albumin 3.0 (L) 3.5 - 5.2 g/dL   AST 41 (H) 0 - 37 U/L   ALT 28 0 - 35 U/L   Alkaline Phosphatase 84 39 - 117 U/L   Total Bilirubin 0.3 0.3 - 1.2 mg/dL   Anion gap 7.0 5 - 15    Assessment: Susan Lawrence is a 34 y.o. G1P0 with an IUP at [redacted]w[redacted]d presenting for IOL (per MFM) for proteinuria  Plan: #Labor: cytotec>pitocin #Pain:  epidural #FWB cat 1 #ID: GBS: neg  #MOF:  breast   CRESENZO-DISHMAN,Susan Lawrence 09/22/2014, 9:27 PM

## 2014-09-22 NOTE — MAU Note (Signed)
Pt reports contractions 

## 2014-09-23 ENCOUNTER — Encounter (HOSPITAL_COMMUNITY): Payer: Self-pay

## 2014-09-23 DIAGNOSIS — O1213 Gestational proteinuria, third trimester: Secondary | ICD-10-CM

## 2014-09-23 LAB — PREPARE RBC (CROSSMATCH)

## 2014-09-23 LAB — CBC
HCT: 30.4 % — ABNORMAL LOW (ref 36.0–46.0)
Hemoglobin: 10 g/dL — ABNORMAL LOW (ref 12.0–15.0)
MCH: 28.7 pg (ref 26.0–34.0)
MCHC: 32.9 g/dL (ref 30.0–36.0)
MCV: 87.1 fL (ref 78.0–100.0)
PLATELETS: 162 10*3/uL (ref 150–400)
RBC: 3.49 MIL/uL — ABNORMAL LOW (ref 3.87–5.11)
RDW: 14.1 % (ref 11.5–15.5)
WBC: 22.8 10*3/uL — ABNORMAL HIGH (ref 4.0–10.5)

## 2014-09-23 LAB — RPR: RPR Ser Ql: NONREACTIVE

## 2014-09-23 MED ORDER — DIBUCAINE 1 % RE OINT: 1.0000 "application " | TOPICAL_OINTMENT | RECTAL | Status: DC | PRN

## 2014-09-23 MED ORDER — OXYCODONE-ACETAMINOPHEN 5-325 MG PO TABS
1.0000 | ORAL_TABLET | ORAL | Status: DC | PRN
  Administered 2014-09-24 – 2014-09-25 (×2): 1 via ORAL
  Filled 2014-09-23 (×2): qty 1

## 2014-09-23 MED ORDER — LANOLIN HYDROUS EX OINT: TOPICAL_OINTMENT | CUTANEOUS | Status: DC | PRN

## 2014-09-23 MED ORDER — SIMETHICONE 80 MG PO CHEW
80.0000 mg | CHEWABLE_TABLET | ORAL | Status: DC | PRN
  Administered 2014-09-25: 80 mg via ORAL
  Filled 2014-09-23: qty 1

## 2014-09-23 MED ORDER — OXYTOCIN 40 UNITS IN LACTATED RINGERS INFUSION - SIMPLE MED
1.0000 m[IU]/min | INTRAVENOUS | Status: DC
Start: 2014-09-23 — End: 2014-09-23
  Administered 2014-09-23 (×2): 2 m[IU]/min via INTRAVENOUS

## 2014-09-23 MED ORDER — SODIUM CHLORIDE 0.9 % IV SOLN
Freq: Once | INTRAVENOUS | Status: AC
Start: 2014-09-23 — End: 2014-09-23
  Administered 2014-09-23: 15:00:00 via INTRAVENOUS

## 2014-09-23 MED ORDER — TETANUS-DIPHTH-ACELL PERTUSSIS 5-2.5-18.5 LF-MCG/0.5 IM SUSP: 0.5000 mL | Freq: Once | INTRAMUSCULAR | Status: DC

## 2014-09-23 MED ORDER — BENZOCAINE-MENTHOL 20-0.5 % EX AERO
1.0000 "application " | INHALATION_SPRAY | CUTANEOUS | Status: DC | PRN
  Administered 2014-09-24: 1 via TOPICAL
  Filled 2014-09-23: qty 56

## 2014-09-23 MED ORDER — PRENATAL MULTIVITAMIN CH
1.0000 | ORAL_TABLET | Freq: Every day | ORAL | Status: DC
  Administered 2014-09-24: 1 via ORAL
  Filled 2014-09-23: qty 1

## 2014-09-23 MED ORDER — SENNOSIDES-DOCUSATE SODIUM 8.6-50 MG PO TABS
2.0000 | ORAL_TABLET | ORAL | Status: DC
  Administered 2014-09-24 – 2014-09-25 (×2): 2 via ORAL
  Filled 2014-09-23 (×2): qty 2

## 2014-09-23 MED ORDER — IBUPROFEN 600 MG PO TABS
600.0000 mg | ORAL_TABLET | Freq: Four times a day (QID) | ORAL | Status: DC
  Administered 2014-09-24 – 2014-09-25 (×7): 600 mg via ORAL
  Filled 2014-09-23 (×7): qty 1

## 2014-09-23 MED ORDER — ONDANSETRON HCL 4 MG/2ML IJ SOLN: 4.0000 mg | INTRAMUSCULAR | Status: DC | PRN

## 2014-09-23 MED ORDER — ACETAMINOPHEN 325 MG PO TABS: 650.0000 mg | ORAL_TABLET | ORAL | Status: DC | PRN

## 2014-09-23 MED ORDER — ZOLPIDEM TARTRATE 5 MG PO TABS: 5.0000 mg | ORAL_TABLET | Freq: Every evening | ORAL | Status: DC | PRN

## 2014-09-23 MED ORDER — OXYCODONE-ACETAMINOPHEN 5-325 MG PO TABS: 2.0000 | ORAL_TABLET | ORAL | Status: DC | PRN

## 2014-09-23 MED ORDER — DIPHENHYDRAMINE HCL 25 MG PO CAPS: 25.0000 mg | ORAL_CAPSULE | Freq: Four times a day (QID) | ORAL | Status: DC | PRN

## 2014-09-23 MED ORDER — WITCH HAZEL-GLYCERIN EX PADS
1.0000 "application " | MEDICATED_PAD | CUTANEOUS | Status: DC | PRN
  Administered 2014-09-24: 1 via TOPICAL

## 2014-09-23 MED ORDER — SODIUM CHLORIDE 0.9 % IV SOLN: Freq: Once | INTRAVENOUS | Status: DC

## 2014-09-23 MED ORDER — DIPHENHYDRAMINE HCL 50 MG/ML IJ SOLN
25.0000 mg | Freq: Once | INTRAMUSCULAR | Status: AC
  Administered 2014-09-23: 25 mg via INTRAVENOUS
  Filled 2014-09-23: qty 1

## 2014-09-23 MED ORDER — ONDANSETRON HCL 4 MG PO TABS
4.0000 mg | ORAL_TABLET | ORAL | Status: DC | PRN
Start: 2014-09-23 — End: 2014-09-25

## 2014-09-23 NOTE — Progress Notes (Signed)
Notified CNM pitocin not started due to FHR  of 80's/90's x , bolus given, O2 @ 10L applied, FHR recovered to 130's.  Per provider pitocin to remain off, will evaluate Pt later.

## 2014-09-23 NOTE — Progress Notes (Signed)
   Susan Lawrence Lawrence is a 34 y.o. G1P0 at 6587w0d  admitted for induction of labor due to proteinuria.  Subjective:  resting Objective: Filed Vitals:   09/23/14 0230 09/23/14 0300 09/23/14 0330 09/23/14 0400  BP: 125/74 123/70 123/65 113/72  Pulse: 77 73 81 79  Temp:      TempSrc:      Resp: 18     Height:      Weight:          FHT:  FHR: 135 bpm, variability: moderate,  accelerations:  Present,  decelerations:  Absent UC:   irregular, every 3-6 minutes SVE:   Dilation: 3.5 Effacement (%): 60 Station: -1 Exam by:: Susan g jones rn    Labs: Lab Results  Component Value Date   WBC 13.1* 09/22/2014   HGB 11.9* 09/22/2014   HCT 35.1* 09/22/2014   MCV 85.6 09/22/2014   PLT 203 09/22/2014    Assessment / Plan: IOL for proteinuria, ripening phase Will start pitocin  Labor: Progressing normally Fetal Wellbeing:  Category I Pain Control:  Labor support without medications Anticipated MOD:  NSVD  CRESENZO-DISHMAN,Susan Lawrence 09/23/2014, 4:51 AM

## 2014-09-23 NOTE — Progress Notes (Signed)
Susan GuiseLizelda Savo is a 34 y.o. G1P0 at 645w0d by ultrasound admitted for induction of labor due to Proteinuria .  Subjective: Doing well. Comfortable with epidural  Objective: BP 126/74 mmHg  Pulse 77  Temp(Src) 97 F (36.1 C) (Axillary)  Resp 20  Ht 5\' 2"  (1.575 m)  Wt 139 lb (63.05 kg)  BMI 25.42 kg/m2  LMP 12/24/2013      FHT:  FHR: 140 bpm, variability: moderate,  accelerations:  Present,  decelerations:  Absent UC:   regular, every 2-3 minutes SVE:   Dilation: 8 Effacement (%): 100 Station: 0 Exam by:: HK  Labs: Lab Results  Component Value Date   WBC 13.1* 09/22/2014   HGB 11.9* 09/22/2014   HCT 35.1* 09/22/2014   MCV 85.6 09/22/2014   PLT 203 09/22/2014    Assessment / Plan: Induction of labor due to Proteinuria,  progressing well on pitocin  Labor: Progressing normally Preeclampsia:  n/a Fetal Wellbeing:  Category I Pain Control:  Epidural I/D:  n/a Anticipated MOD:  NSVD  Anik Wesch 09/23/2014, 10:57 AM

## 2014-09-23 NOTE — Progress Notes (Signed)
Patient ID: Nobie PutnamLizelda Baumgardner, female   DOB: 04-May-1981, 34 y.o.   MRN: [id number] Doing well.  Pushing now  FHR has variable decels with UCs  Dilation: 10 Dilation Complete Date: 09/23/14 Dilation Complete Time: 1155 Effacement (%): 100 Cervical Position: Anterior Station: +1 Presentation: Vertex Exam by:: vr  Anticipate SVd soon.

## 2014-09-23 NOTE — Lactation Note (Addendum)
This note was copied from the chart of Susan Lawrence. Lactation Consultation Note New mom, RN assessing pt. And will have to come back later.  Came back, has visitors at bedside, will return. Returned and mom is sitting on edge of bed eating supper. Asked how BF was going, mom said she didn't have any milk. Asked if I could check for colostrum and hand express. Stated yes,  Mom has small breast heavy w/semi flat small nipples and areolas. Hand expression taught, note sm. Drop of colostrum. Areola not compressible. Explained probable need a NS and pump.  Asked if she wanted to finish eating her supper and Mom said yes please.   Unable to get back to room d/t a lot of other long calls. Spoke w/RN asking to fit pt. W/NS, stated she was currently working w/mom. Explained visited several times, and would follow in am.  Patient Name: Susan Lawrence ZOXWR'UToday's Date: 09/23/2014 Reason for consult: Initial assessment   Maternal Data Does the patient have breastfeeding experience prior to this delivery?: No  Feeding Feeding Type: Breast Fed Length of feed: 0 min  LATCH Score/Interventions       Type of Nipple: Flat (semi flat)  Comfort (Breast/Nipple): Soft / non-tender           Lactation Tools Discussed/Used     Consult Status Consult Status: Follow-up Date: 09/24/14 Follow-up type: In-patient    Charyl DancerCARVER, Kathlen Sakurai G 09/23/2014, 10:29 PM

## 2014-09-23 NOTE — Progress Notes (Signed)
Late Entry:  At delivery a stage 3 PPH was called following a change in VS and EBL of 1700.  Discussed with Dr. Debroah LoopArnold who asked that the team not be called as the VS and bleeding had resolved.

## 2014-09-24 LAB — TYPE AND SCREEN
ABO/RH(D): A POS
Antibody Screen: NEGATIVE
Unit division: 0
Unit division: 0

## 2014-09-24 LAB — CBC
HCT: 27.6 % — ABNORMAL LOW (ref 36.0–46.0)
Hemoglobin: 9.6 g/dL — ABNORMAL LOW (ref 12.0–15.0)
MCH: 29.7 pg (ref 26.0–34.0)
MCHC: 34.8 g/dL (ref 30.0–36.0)
MCV: 85.4 fL (ref 78.0–100.0)
Platelets: 123 10*3/uL — ABNORMAL LOW (ref 150–400)
RBC: 3.23 MIL/uL — AB (ref 3.87–5.11)
RDW: 13.9 % (ref 11.5–15.5)
WBC: 25.9 10*3/uL — ABNORMAL HIGH (ref 4.0–10.5)

## 2014-09-24 NOTE — Progress Notes (Addendum)
Post Partum Day  Subjective: no complaints, up ad lib, voiding and tolerating PO  Objective: Blood pressure 107/63, pulse 73, temperature 97.2 F (36.2 C), temperature source Oral, resp. rate 18, height 5\' 2"  (1.575 m), weight 139 lb (63.05 kg), last menstrual period 12/24/2013, SpO2 97 %, unknown if currently breastfeeding.  Physical Exam:  General: alert, cooperative and no distress Lochia: appropriate Uterine Fundus: firm Incision: healing well DVT Evaluation: No evidence of DVT seen on physical exam.   Recent Labs  09/23/14 1445 09/24/14 0601  HGB 10.0* 9.6*  HCT 30.4* 27.6*    Assessment/Plan: Plan for discharge tomorrow and Breastfeeding   LOS: 2 days   [facility] HospitalWILLIAMS,Tyquavious Gamel 09/24/2014, 12:42 PM

## 2014-09-24 NOTE — Anesthesia Postprocedure Evaluation (Signed)
Anesthesia Post Note  Patient: Susan Lawrence  Procedure(s) Performed: * No procedures listed *  Anesthesia type: Epidural  Patient location: Mother/Baby  Post pain: Pain level controlled  Post assessment: Post-op Vital signs reviewed  Last Vitals:  Filed Vitals:   09/24/14 0525  BP: 107/63  Pulse: 73  Temp: 36.2 C  Resp: 18    Post vital signs: Reviewed  Level of consciousness: awake  Complications: No apparent anesthesia complications

## 2014-09-25 ENCOUNTER — Inpatient Hospital Stay (HOSPITAL_COMMUNITY): Payer: No Typology Code available for payment source

## 2014-09-25 MED ORDER — OXYCODONE-ACETAMINOPHEN 5-325 MG PO TABS: 1.0000 | ORAL_TABLET | ORAL | Status: DC | PRN

## 2014-09-25 MED ORDER — IOHEXOL 300 MG/ML  SOLN
50.0000 mL | INTRAMUSCULAR | Status: AC
Start: 2014-09-25 — End: 2014-09-25

## 2014-09-25 MED ORDER — FERROUS SULFATE 325 (65 FE) MG PO TABS: 325.0000 mg | ORAL_TABLET | Freq: Every day | ORAL | Status: DC

## 2014-09-25 NOTE — Discharge Summary (Signed)
Obstetric Discharge Summary Reason for Admission: induction of labor 2/2 proteinuria Prenatal Procedures: NST and ultrasound Intrapartum Procedures: spontaneous vaginal delivery Postpartum Procedures: none Complications-Operative and Postpartum: 3rd degree perineal laceration, postpartum hemorrhage  Delivery Note Pushed well with crowning shortly thereafter.  At 1:52 PM a viable and healthy female was delivered via Vaginal, Spontaneous Delivery (Presentation: ; Occiput Anterior).  APGAR: , ; weight  .pending   Placenta status:  Delayed, delivered by Dr Debroah Loop via manual extraction. , .  Cord: 3 vessels  with the following complications: Retained placenta.    Anesthesia: Epidural  Episiotomy: None Lacerations: 3rd degree Suture Repair: 3.0 vicryl  Repair done under epidural anesthesia by Dr Delanna Ahmadi. Blood Loss (mL):  1500  Stage 3 Hemorrhage protocol initiated.  Patient had one reading of hypotension and tachycardia, improved with fluids. Dr Debroah Loop states we did not need to call team.  Will transfuse 2 units of blood, draw CBC and place her on oxygen. Second IV started. Fluids administered.   Filed Vitals:   09/23/14 1100 09/23/14 1130 09/23/14 1200 09/23/14 1230  BP: 129/85 123/71 130/80 127/73  Pulse: 87 78 74 76  Temp:      TempSrc:      Resp:      Height:      Weight:        Mom to postpartum.  Baby to Couplet care / Skin to Skin.  [facility] 09/23/2014, 2:39 PM     Hospital Course:  Active Problems:   Proteinuria affecting pregnancy in third trimester, antepartum   Third degree laceration of perineum during delivery, postpartum   Susan Lawrence is a 34 y.o. G1P1001 s/p SVD with 3rd degree laceration.  Patient was admitted for IOL 2/2 proteinuria.  She has postpartum course that was uncomplicated including no problems with ambulating, PO intake, urination, pain, or bleeding. The pt feels ready to go home and  will be discharged with outpatient follow-up.    Today: No acute events overnight.  Pt denies problems with ambulating, voiding or po intake.  She denies nausea or vomiting.  Pain is well controlled.  She has had flatus. She has had bowel movement.  Lochia Moderate.  Method of Feeding: bottle.  Overnight had severe abdominal pain, had bowel movement shortly after I evaluated her and since abdominal pain has greatly improved to the point that she subsequently declined CT scan.  Physical Exam:  General: alert and cooperative Lochia: appropriate Uterine Fundus: firm Abdomen: soft, no longer with full RLQ with distinct margins DVT Evaluation: No evidence of DVT seen on physical exam.  H/H: Lab Results  Component Value Date/Time   HGB 9.6* 09/24/2014 06:01 AM   HCT 27.6* 09/24/2014 06:01 AM    Discharge Diagnoses: Term Pregnancy-delivered and 3rd degree laceration  Discharge Information: Date: 09/25/2014 Activity: pelvic rest Diet: routine  Medications: Ibuprofen and Percocet Breast feeding:  Yes Condition: stable Instructions: refer to handout Discharge to: home      Medication List    TAKE these medications        ferrous sulfate 325 (65 FE) MG tablet  Commonly known as:  FERROUSUL  Take 1 tablet (325 mg total) by mouth daily with breakfast.     oxyCODONE-acetaminophen 5-325 MG per tablet  Commonly known as:  PERCOCET/ROXICET  Take 1 tablet by mouth every 4 (four) hours as needed (for pain scale 4-7).     PRENATAL/FOLIC ACID PO  Take 2 tablets by mouth daily.  Perry MountACOSTA,Teryl Gubler ROCIO ,MD OB Fellow 09/25/2014,8:57 AM

## 2014-09-25 NOTE — Progress Notes (Signed)
Earlier mom had complained of pain on the right side of abdomen. Mom stated something is wrong. Mom could not get out of bed. Dr came to assess patient and ordered a C T scan. After an hour, the patient felt better and had 2x Bm and more gas. Patient then refused the CT scan. RN called Dr Arnette FeltsAcousta to inform her. Also RN encouraged mom not to sleep with baby and dad not to fall asleep on sofa with baby.

## 2014-09-25 NOTE — Progress Notes (Signed)
Asked to evaluate patient with severe abdominal pain.  Pain severe, very sensitive, is passing gas, no associated nausea/vomiting or fever.  Pain sudden onset, has not had before.  No hx of recent/previous surgeries.  BP 102/69 mmHg  Pulse 120  Temp(Src) 98.4 F (36.9 C) (Oral)  Resp 18  Ht 5\' 2"  (1.575 m)  Wt 139 lb (63.05 kg)  BMI 25.42 kg/m2  SpO2 97%  LMP 12/24/2013  Breastfeeding? Unknown Gen: moderate distress Abd: soft, right lower quadrant distended with margins of distension easily palpable, no rebound, no fluctuance, no masses, no murphy's sign. Fundus: firm, deep  34yo G1P1 s/p SVD with PPH and severe abdominal pain - no fever/nausea/recent surgeries, at this time pt to use heating pad and will give simethicone.  If no improvement will proceed with CT scan given very distended abdomen and easily palpable margins to rule out other obstructive pathology or infection such as appendicitis or acute obstruction.  Pt agreeable with plan  Dorthea Maina ROCIO, MD 2:00 AM

## 2014-09-25 NOTE — Lactation Note (Signed)
This note was copied from the chart of Susan Maeva Alicea. Lactation Consultation Note; Baby fussing as I went into room. Offered assist with latch - mom agreeable. Trying to latch baby in cradle hold sitting on edge of bed. Assisted mom with positioning in football hold in chair, Mom needs much assist with positioning and getting baby latched to breast. Baby took several attempts to latch- mom reports it feels like she is biting. Mom concerned about not having enough milk- has been giving bottles of formula through the night because she was so fussy. Encouraged to always breast feed first on both breasts, then give formula if baby is still fussy. Nursed on both breasts for 15 min total then baby off to sleep. Able to hand express small drop of Colostrum after the baby nursed. No questions at present, to call for assist prn   Patient Name: Susan Lawrence ZOXWR'UToday's Date: 09/25/2014 Reason for consult: Follow-up assessment   Maternal Data Formula Feeding for Exclusion: No Has patient been taught Hand Expression?: Yes Does the patient have breastfeeding experience prior to this delivery?: No  Feeding Feeding Type: Breast Fed Length of feed: 15 min  LATCH Score/Interventions Latch: Repeated attempts needed to sustain latch, nipple held in mouth throughout feeding, stimulation needed to elicit sucking reflex.  Audible Swallowing: A few with stimulation  Type of Nipple: Flat  Comfort (Breast/Nipple): Soft / non-tender     Hold (Positioning): Assistance needed to correctly position infant at breast and maintain latch. Intervention(s): Breastfeeding basics reviewed;Support Pillows;Position options  LATCH Score: 6  Lactation Tools Discussed/Used     Consult Status      Pamelia HoitWeeks, Camron Essman D 09/25/2014, 8:43 AM

## 2014-09-25 NOTE — Discharge Instructions (Signed)

## 2016-02-05 ENCOUNTER — Other Ambulatory Visit: Payer: Self-pay | Admitting: Family Medicine

## 2016-02-06 LAB — CMP12+LP+TP+TSH+6AC+CBC/D/PLT
ALBUMIN: 4.6 g/dL (ref 3.5–5.5)
ALT: 17 IU/L (ref 0–32)
AST: 24 IU/L (ref 0–40)
Albumin/Globulin Ratio: 1.4 (ref 1.2–2.2)
Alkaline Phosphatase: 55 IU/L (ref 39–117)
BASOS ABS: 0 10*3/uL (ref 0.0–0.2)
BUN/Creatinine Ratio: 17 (ref 9–23)
BUN: 13 mg/dL (ref 6–20)
Basos: 1 %
Bilirubin Total: 0.4 mg/dL (ref 0.0–1.2)
CALCIUM: 9.3 mg/dL (ref 8.7–10.2)
CHOL/HDL RATIO: 3.3 ratio (ref 0.0–4.4)
CHOLESTEROL TOTAL: 143 mg/dL (ref 100–199)
Chloride: 102 mmol/L (ref 96–106)
Creatinine, Ser: 0.77 mg/dL (ref 0.57–1.00)
EOS (ABSOLUTE): 0.2 10*3/uL (ref 0.0–0.4)
EOS: 3 %
FREE THYROXINE INDEX: 2.1 (ref 1.2–4.9)
GFR calc Af Amer: 115 mL/min/{1.73_m2} (ref >59)
GFR calc non Af Amer: 100 mL/min/{1.73_m2} (ref >59)
GGT: 18 IU/L (ref 0–60)
GLUCOSE: 81 mg/dL (ref 65–99)
Globulin, Total: 3.3 g/dL (ref 1.5–4.5)
HDL: 43 mg/dL (ref >39)
HEMOGLOBIN: 13 g/dL (ref 11.1–15.9)
Hematocrit: 40.3 % (ref 34.0–46.6)
IMMATURE GRANULOCYTES: 0 %
Immature Grans (Abs): 0 10*3/uL (ref 0.0–0.1)
Iron: 116 ug/dL (ref 27–159)
LDH: 204 IU/L (ref 119–226)
LDL CALC: 82 mg/dL (ref 0–99)
Lymphocytes Absolute: 1.5 10*3/uL (ref 0.7–3.1)
Lymphs: 28 %
MCH: 27.1 pg (ref 26.6–33.0)
MCHC: 32.3 g/dL (ref 31.5–35.7)
MCV: 84 fL (ref 79–97)
Monocytes Absolute: 0.4 10*3/uL (ref 0.1–0.9)
Monocytes: 8 %
NEUTROS PCT: 60 %
Neutrophils Absolute: 3.2 10*3/uL (ref 1.4–7.0)
PLATELETS: 270 10*3/uL (ref 150–379)
Phosphorus: 3.3 mg/dL (ref 2.5–4.5)
Potassium: 4.4 mmol/L (ref 3.5–5.2)
RBC: 4.79 x10E6/uL (ref 3.77–5.28)
RDW: 13.8 % (ref 12.3–15.4)
Sodium: 140 mmol/L (ref 134–144)
T3 UPTAKE RATIO: 29 % (ref 24–39)
T4, Total: 7.4 ug/dL (ref 4.5–12.0)
TSH: 0.785 u[IU]/mL (ref 0.450–4.500)
Total Protein: 7.9 g/dL (ref 6.0–8.5)
Triglycerides: 92 mg/dL (ref 0–149)
URIC ACID: 5.2 mg/dL (ref 2.5–7.1)
VLDL CHOLESTEROL CAL: 18 mg/dL (ref 5–40)
WBC: 5.3 10*3/uL (ref 3.4–10.8)

## 2016-02-06 LAB — HGB A1C W/O EAG: Hgb A1c MFr Bld: 5.5 % (ref 4.8–5.6)

## 2017-02-18 ENCOUNTER — Ambulatory Visit: Payer: Self-pay | Admitting: *Deleted

## 2017-02-18 VITALS — BP 100/73 | HR 69 | Ht 63.0 in | Wt 120.0 lb

## 2017-02-18 DIAGNOSIS — Z0000 Encounter for general adult medical examination without abnormal findings: Secondary | ICD-10-CM

## 2017-02-18 NOTE — Progress Notes (Signed)
[employer] insurance premium discount evaluation: Labs Drawn. [employer] ROI form signed. Tobacco Free Attestation form signed.  Forms placed in paper chart.  No PCP to route results to. 

## 2017-02-19 LAB — CMP12+LP+TP+TSH+6AC+CBC/D/PLT
ALT: 14 IU/L (ref 0–32)
AST: 20 IU/L (ref 0–40)
Albumin/Globulin Ratio: 1.3 (ref 1.2–2.2)
Albumin: 4.5 g/dL (ref 3.5–5.5)
Alkaline Phosphatase: 51 IU/L (ref 39–117)
BUN/Creatinine Ratio: 18 (ref 9–23)
BUN: 14 mg/dL (ref 6–20)
Basophils Absolute: 0 10*3/uL (ref 0.0–0.2)
Basos: 0 %
Bilirubin Total: 0.4 mg/dL (ref 0.0–1.2)
CALCIUM: 9.6 mg/dL (ref 8.7–10.2)
Chloride: 101 mmol/L (ref 96–106)
Chol/HDL Ratio: 3.8 ratio (ref 0.0–4.4)
Cholesterol, Total: 168 mg/dL (ref 100–199)
Creatinine, Ser: 0.79 mg/dL (ref 0.57–1.00)
EOS (ABSOLUTE): 0.2 10*3/uL (ref 0.0–0.4)
Eos: 2 %
Estimated CHD Risk: 0.8 times avg. (ref 0.0–1.0)
FREE THYROXINE INDEX: 2.5 (ref 1.2–4.9)
GFR calc Af Amer: 111 mL/min/{1.73_m2} (ref >59)
GFR calc non Af Amer: 96 mL/min/{1.73_m2} (ref >59)
GGT: 17 IU/L (ref 0–60)
GLOBULIN, TOTAL: 3.4 g/dL (ref 1.5–4.5)
Glucose: 81 mg/dL (ref 65–99)
HDL: 44 mg/dL (ref >39)
Hematocrit: 39 % (ref 34.0–46.6)
Hemoglobin: 12.7 g/dL (ref 11.1–15.9)
Immature Grans (Abs): 0 10*3/uL (ref 0.0–0.1)
Immature Granulocytes: 0 %
Iron: 66 ug/dL (ref 27–159)
LDH: 165 IU/L (ref 119–226)
LDL Calculated: 95 mg/dL (ref 0–99)
LYMPHS: 30 %
Lymphocytes Absolute: 2 10*3/uL (ref 0.7–3.1)
MCH: 28 pg (ref 26.6–33.0)
MCHC: 32.6 g/dL (ref 31.5–35.7)
MCV: 86 fL (ref 79–97)
MONOS ABS: 0.5 10*3/uL (ref 0.1–0.9)
Monocytes: 7 %
NEUTROS ABS: 4.2 10*3/uL (ref 1.4–7.0)
NEUTROS PCT: 61 %
PHOSPHORUS: 3.2 mg/dL (ref 2.5–4.5)
POTASSIUM: 4.5 mmol/L (ref 3.5–5.2)
Platelets: 274 10*3/uL (ref 150–379)
RBC: 4.53 x10E6/uL (ref 3.77–5.28)
RDW: 13.6 % (ref 12.3–15.4)
Sodium: 139 mmol/L (ref 134–144)
T3 Uptake Ratio: 29 % (ref 24–39)
T4 TOTAL: 8.6 ug/dL (ref 4.5–12.0)
TOTAL PROTEIN: 7.9 g/dL (ref 6.0–8.5)
TSH: 1.58 u[IU]/mL (ref 0.450–4.500)
Triglycerides: 146 mg/dL (ref 0–149)
Uric Acid: 4.2 mg/dL (ref 2.5–7.1)
VLDL Cholesterol Cal: 29 mg/dL (ref 5–40)
WBC: 6.9 10*3/uL (ref 3.4–10.8)

## 2017-02-19 LAB — HGB A1C W/O EAG: Hgb A1c MFr Bld: 5.5 % (ref 4.8–5.6)

## 2017-02-21 NOTE — Progress Notes (Signed)
Results reviewed with pt's spouse at her request. BP and all labs WNL. Diet and exercise recommendations for health maintenance discussed. Routine f/u with pcp. Copy of labs provided to pt. No pcp to route results to. No further questions/concerns.

## 2018-02-27 ENCOUNTER — Ambulatory Visit: Payer: Self-pay

## 2018-03-03 ENCOUNTER — Ambulatory Visit (INDEPENDENT_AMBULATORY_CARE_PROVIDER_SITE_OTHER): Payer: No Typology Code available for payment source | Admitting: Women's Health

## 2018-03-03 ENCOUNTER — Encounter: Payer: Self-pay | Admitting: Women's Health

## 2018-03-03 VITALS — BP 114/66 | Ht 64.0 in | Wt 122.0 lb

## 2018-03-03 DIAGNOSIS — R102 Pelvic and perineal pain: Secondary | ICD-10-CM | POA: Diagnosis not present

## 2018-03-03 DIAGNOSIS — Z13220 Encounter for screening for lipoid disorders: Secondary | ICD-10-CM | POA: Diagnosis not present

## 2018-03-03 DIAGNOSIS — Z1151 Encounter for screening for human papillomavirus (HPV): Secondary | ICD-10-CM

## 2018-03-03 DIAGNOSIS — Z01419 Encounter for gynecological examination (general) (routine) without abnormal findings: Secondary | ICD-10-CM

## 2018-03-03 LAB — CBC WITH DIFFERENTIAL/PLATELET
Basophils Absolute: 53 cells/uL (ref 0–200)
Basophils Relative: 0.8 %
Eosinophils Absolute: 178 cells/uL (ref 15–500)
Eosinophils Relative: 2.7 %
HEMATOCRIT: 41 % (ref 35.0–45.0)
Hemoglobin: 13.4 g/dL (ref 11.7–15.5)
LYMPHS ABS: 2270 {cells}/uL (ref 850–3900)
MCH: 27.7 pg (ref 27.0–33.0)
MCHC: 32.7 g/dL (ref 32.0–36.0)
MCV: 84.9 fL (ref 80.0–100.0)
MONOS PCT: 6.5 %
MPV: 11.5 fL (ref 7.5–12.5)
NEUTROS PCT: 55.6 %
Neutro Abs: 3670 cells/uL (ref 1500–7800)
Platelets: 224 10*3/uL (ref 140–400)
RBC: 4.83 10*6/uL (ref 3.80–5.10)
RDW: 12.3 % (ref 11.0–15.0)
Total Lymphocyte: 34.4 %
WBC mixed population: 429 cells/uL (ref 200–950)
WBC: 6.6 10*3/uL (ref 3.8–10.8)

## 2018-03-03 LAB — LIPID PANEL
CHOL/HDL RATIO: 3.6 (calc) (ref <5.0)
Cholesterol: 160 mg/dL (ref <200)
HDL: 45 mg/dL — AB (ref >50)
LDL Cholesterol (Calc): 96 mg/dL (calc)
Non-HDL Cholesterol (Calc): 115 mg/dL (calc) (ref <130)
Triglycerides: 99 mg/dL (ref <150)

## 2018-03-03 LAB — GLUCOSE, RANDOM: GLUCOSE: 88 mg/dL (ref 65–99)

## 2018-03-03 NOTE — Addendum Note (Signed)
Addended by: Harrington Challenger on: 03/03/2018 04:50 PM   Modules accepted: Orders

## 2018-03-03 NOTE — Addendum Note (Signed)
Addended by: Harrington Challenger on: 03/03/2018 04:38 PM   Modules accepted: Orders

## 2018-03-03 NOTE — Patient Instructions (Signed)

## 2018-03-03 NOTE — Progress Notes (Addendum)
Susan Lawrence [birth year] [id number]    History:    Presents for new patient annual exam.  Reports normal Pap history.  Brought copy of negative breast fibroadenoma done [place] the [place]. Moved here for marriage, [place]  4 [place] .  History of cervical polyp 2016.     Currently using the rhythm method and does not wish to have any other contraceptives. Husband (38 yo) does not want any more children, she would be happy to have a second child.  States has had a questionable lump that has been investigated in the past in the upper right quadrant it did move more lateral when she was pregnant and then moved back to this upper right side.  Questionable lipoma?.  Nontender.  Past medical history, past surgical history, family history and social history were all reviewed and documented in the EPIC chart. Lives with husband and mother inlaw x 4 years.  Susan Lawrence is 25 years old.  Denies health problems during pregnancy delivered by health department.  Works at replacements.  Father deceased from heart disease.  Mother healthy.  ROS:  A ROS was performed and pertinent positives and negatives are included.  Exam:  Vitals:   03/03/18 0809  BP: 114/66  Weight: 122 lb (55.3 kg)  Height: 5\' 4"  (1.626 m)   Body mass index is 20.94 kg/m.   General appearance:  Normal Thyroid:  Symmetrical, normal in size, without palpable masses or nodularity. Respiratory  Auscultation:  Clear without wheezing or rhonchi Cardiovascular  Auscultation:  Regular rate, without rubs, murmurs or gallops  Edema/varicosities:  Not grossly evident Abdominal  Soft,nontender, 3 to 4 cm smooth mobile questionable lipoma upper right quadrant , no guarding or rebound.  Liver/spleen:  No organomegaly noted  Hernia:  None appreciated  Skin  Inspection:  Grossly normal   Breasts: Examined lying and sitting.     Right: Without masses, retractions, discharge or axillary adenopathy.     Left: Without  masses, retractions, discharge or axillary adenopathy. Gentitourinary   Inguinal/mons:  Normal without inguinal adenopathy  External genitalia:  Normal  BUS/Urethra/Skene's glands:  Normal  Vagina:  Normal  Cervix:  Normal  Uterus:  normal in size, shape and contour.  Midline and mobile  Adnexa/parametria:     Rt: Without masses or tenderness.   Lt: Without masses or tenderness.  Anus and perineum: Normal  Digital rectal exam: Normal sphincter tone without palpated masses or tenderness  Assessment/Plan:  37 y.o.  WF G1P1001. 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  Discussed increasing exercise and continue to maintain healthy weight.  SBE, annual screening mammogram at 40, increase in calcium, vitamin D, and skin checks.  CBC, glucose, lipid panel, Pap with HR HPV typing, new screening guidelines reviewed.  We will schedule ultrasound.   Harrington Challenger [facility], 9:12 AM 03/03/2018

## 2018-03-05 LAB — PAP, TP IMAGING W/ HPV RNA, RFLX HPV TYPE 16,18/45: HPV DNA HIGH RISK: NOT DETECTED

## 2018-03-11 NOTE — Telephone Encounter (Signed)
Telephone call to review recommendations, encouraged to increase regular cardio type exercise 30 minutes most days of the week.  Also schedule ultrasound questionable lipoma on right abdomen.

## 2018-07-21 ENCOUNTER — Encounter: Payer: Self-pay | Admitting: Registered Nurse

## 2018-07-21 ENCOUNTER — Ambulatory Visit: Payer: Self-pay | Admitting: Registered Nurse

## 2018-07-21 VITALS — BP 103/74 | HR 72 | Temp 98.4°F

## 2018-07-21 DIAGNOSIS — L309 Dermatitis, unspecified: Secondary | ICD-10-CM

## 2018-07-21 MED ORDER — TRIAMCINOLONE ACETONIDE 0.1 % EX CREA
1.0000 | TOPICAL_CREAM | Freq: Two times a day (BID) | CUTANEOUS | 0 refills | Status: DC
Start: 2018-07-21 — End: 2020-04-27

## 2018-07-21 MED ORDER — CETIRIZINE HCL 10 MG PO TABS: 10.0000 mg | ORAL_TABLET | Freq: Two times a day (BID) | ORAL | 0 refills | Status: DC

## 2018-07-21 MED ORDER — DIPHENHYDRAMINE HCL 2 % EX GEL: 1.0000 "application " | Freq: Two times a day (BID) | CUTANEOUS | 0 refills | Status: AC | PRN

## 2018-07-21 NOTE — Progress Notes (Signed)
Subjective:    Patient ID: Susan Lawrence, female    DOB: 09-19-1980, 38 y.o.   MRN: [id number]  37y/o new female pt c/o rash with itching, burning, erythema to R hand x2-3 weeks. 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 Also wearing plastic gloves at home in the past few weeks while preparing food due to rash. Washing her hands makes rash itch and burn worse. Using hydrocortisone cream at home without relief.  Using OTC pump lotion also unsure of brand.       Review of Systems  Constitutional: Negative for activity change, appetite change, chills, diaphoresis, fatigue and fever.  HENT: Negative for trouble swallowing and voice change.   Eyes: Negative for photophobia and visual disturbance.  Respiratory: Negative for cough, choking, chest tightness, shortness of breath, wheezing and stridor.   Cardiovascular: Negative for chest pain.  Gastrointestinal: Negative for diarrhea, nausea and vomiting.  Endocrine: Negative for cold intolerance and heat intolerance.  Genitourinary: Negative for difficulty urinating.  Musculoskeletal: Negative for arthralgias, myalgias, neck pain and neck stiffness.  Skin: Positive for color change and rash. Negative for pallor and wound.  Allergic/Immunologic: Negative for environmental allergies and food allergies.  Neurological: Negative for tremors and weakness.  Hematological: Negative for adenopathy. Does not bruise/bleed easily.  Psychiatric/Behavioral: Negative for behavioral problems, confusion and sleep disturbance.       Objective:   Physical Exam Vitals signs and nursing note reviewed.  Constitutional:      General: She is awake. She is not in acute distress.    Appearance: Normal appearance. She is well-developed and well-groomed. She is not ill-appearing, toxic-appearing or diaphoretic.  HENT:     Head: Normocephalic and atraumatic.   Jaw: There is normal jaw occlusion.     Salivary Glands: Right salivary gland is not diffusely enlarged or tender. Left salivary gland is not diffusely enlarged or tender.     Right Ear: Hearing and external ear normal.     Left Ear: Hearing and external ear normal.     Nose: Nose normal.     Right Turbinates: Not enlarged, swollen or pale.     Left Turbinates: Not enlarged, swollen or pale.     Mouth/Throat:     Lips: Pink. No lesions.     Mouth: Mucous membranes are moist. No injury, lacerations, oral lesions or angioedema.     Tongue: No lesions.     Pharynx: No pharyngeal swelling, oropharyngeal exudate, posterior oropharyngeal erythema or uvula swelling.     Tonsils: No tonsillar exudate.  Eyes:     General: Lids are normal. No visual field deficit.       Right eye: No discharge.        Left eye: No discharge.     Extraocular Movements: Extraocular movements intact.     Conjunctiva/sclera: Conjunctivae normal.     Pupils: Pupils are equal, round, and reactive to light.  Neck:     Musculoskeletal: Normal range of motion and neck supple. No neck rigidity.     Trachea: Trachea and phonation normal.  Cardiovascular:     Rate and Rhythm: Normal rate and regular rhythm.     Pulses:          Radial pulses are 2+ on the right side and 2+ on the left side.     Heart sounds: Normal heart sounds.  Pulmonary:     Effort: Pulmonary effort is normal.  No respiratory distress.     Breath sounds: Normal breath sounds and air entry. No stridor, decreased air movement or transmitted upper airway sounds. No decreased breath sounds, wheezing, rhonchi or rales.  Abdominal:     Palpations: Abdomen is soft.  Musculoskeletal: Normal range of motion.        General: Tenderness present. No swelling or deformity.     Right shoulder: Normal.     Left shoulder: Normal.     Right elbow: Normal.    Left elbow: Normal.     Right wrist: Normal.     Left wrist: Normal.     Right hip: Normal.     Left  hip: Normal.     Right knee: Normal.     Left knee: Normal.     Cervical back: Normal.     Right forearm: Normal.     Left forearm: Normal.     Right hand: She exhibits tenderness and swelling. She exhibits normal range of motion, no bony tenderness, normal two-point discrimination, normal capillary refill, no deformity and no laceration. Normal strength noted. She exhibits no finger abduction, no thumb/finger opposition and no wrist extension trouble.     Left hand: Normal.       Hands:     Right lower leg: No edema.     Left lower leg: No edema.     Comments: Slight tenderness medial right hand with palpation over rash 0-1+/4 nonpitting edema right 2nd digit  Lymphadenopathy:     Head:     Right side of head: No submental, submandibular, tonsillar, preauricular, posterior auricular or occipital adenopathy.     Left side of head: No submental, submandibular, tonsillar, preauricular, posterior auricular or occipital adenopathy.     Cervical: No cervical adenopathy.     Right cervical: No superficial cervical adenopathy.    Left cervical: No superficial cervical adenopathy.  Skin:    General: Skin is warm and dry.     Capillary Refill: Capillary refill takes less than 2 seconds.     Coloration: Skin is not ashen, cyanotic, jaundiced, mottled, pale or sallow.     Findings: Abrasion, erythema and rash present. No abscess, acne, bruising, burn, ecchymosis, laceration, lesion, petechiae or wound. Rash is macular and papular. Rash is not crusting, nodular, purpuric, pustular, scaling, urticarial or vesicular.     Nails: There is no clubbing.      Comments: Right medial hand dorsal greater than palmar surfaces macular erythema a few scattered papules and along  Medial 2nd digit 1-47mm abrasion noted fine scab brown  Neurological:     General: No focal deficit present.     Mental Status: She is alert and oriented to person, place, and time. Mental status is at baseline.     GCS: GCS eye  subscore is 4. GCS verbal subscore is 5. GCS motor subscore is 6.     Cranial Nerves: Cranial nerves are intact. No cranial nerve deficit, dysarthria or facial asymmetry.     Sensory: Sensation is intact.     Motor: Motor function is intact. No weakness, tremor, atrophy, abnormal muscle tone, seizure activity or pronator drift.     Coordination: Coordination is intact.     Gait: Gait is intact. Gait normal.     Comments: In/out of chair without difficulty; gait sure and steady in hallway  Psychiatric:        Mood and Affect: Mood normal.        Speech: Speech normal.  Behavior: Behavior normal. Behavior is cooperative.        Thought Content: Thought content normal.        Judgment: Judgment normal.           Assessment & Plan:  A-dermatitis hand right acute initial visit  P-contact versus atopic dermatitis right hand dominant hand.  Change from pump lotion as increased alcohol content fast drying and fragrance switch to plain vaseline/petroleum jelly/aquaphor or eucerin instead.  Electronic Rx triamcinolone 0.1% apply BID x 2 weeks and apply emollient over the top BID also 30grams #1 RF0 as failed hydrocortisone 1% OTC. Wash hands before and after application.  Avoid hot steam showers or soaking hands in hot water.  Apply emollient twice a day e.g. Fragrance free vaseline.  May apply ice/cold compress 5 minutes QID prn itching/swelling.   Avoid harsh/abrasive soaps use fragrance free/sensitive like dove/cetaphil.    Medication as directed.    Avoid hot water on hands, wet hands before applying soap, avoid rubbing rash area.  May use zyrtec 10mg  po BID prn itching and/or apply ice 5 minutes QID prn or apply calagel BID to affected area also for itching.  Scratching could cause secondary infection avoid scratching.  May continue wearing gloves for  Work and cleaning at home.  Exitcare handout printed and given on contact dermatitis.  Patient to follow up if swelling, worsening pain,  purulent drainage, no improvement with plan of care.  Patient verbalized understanding information/instructions, agreed with plan of care and had no further questions at this time.

## 2018-07-21 NOTE — Patient Instructions (Signed)
Contact Dermatitis  Dermatitis is redness, soreness, and swelling (inflammation) of the skin. Contact dermatitis is a reaction to certain substances that touch the skin. Many different substances can cause contact dermatitis. There are two types of contact dermatitis:   Irritant contact dermatitis. This type is caused by something that irritates your skin, such as having dry hands from washing them too often with soap. This type does not require previous exposure to the substance for a reaction to occur. This is the most common type.   Allergic contact dermatitis. This type is caused by a substance that you are allergic to, such as poison ivy. This type occurs when you have been exposed to the substance (allergen) and develop a sensitivity to it. Dermatitis may develop soon after your first exposure to the allergen, or it may not develop until the next time you are exposed and every time thereafter.  What are the causes?  Irritant contact dermatitis is most commonly caused by exposure to:   Makeup.   Soaps.   Detergents.   Bleaches.   Acids.   Metal salts, such as nickel.  Allergic contact dermatitis is most commonly caused by exposure to:   Poisonous plants.   Chemicals.   Jewelry.   Latex.   Medicines.   Preservatives in products, such as clothing.  What increases the risk?  You are more likely to develop this condition if you have:   A job that exposes you to irritants or allergens.   Certain medical conditions, such as asthma or eczema.  What are the signs or symptoms?  Symptoms of this condition may occur on your body anywhere the irritant has touched you or is touched by you.   Symptoms include:  ? Dryness or flaking.  ? Redness.  ? Cracks.  ? Itching.  ? Pain or a burning feeling.  ? Blisters.  ? Drainage of small amounts of blood or clear fluid from skin cracks.  With allergic contact dermatitis, there may also be swelling in areas such as the eyelids, mouth, or genitals.  How is this  diagnosed?  This condition is diagnosed with a medical history and physical exam.   A patch skin test may be performed to help determine the cause.   If the condition is related to your job, you may need to see an occupational medicine specialist.  How is this treated?  This condition is treated by checking for the cause of the reaction and protecting your skin from further contact. Treatment may also include:   Steroid creams or ointments. Oral steroid medicines may be needed in more severe cases.   Antibiotic medicines or antibacterial ointments, if a skin infection is present.   Antihistamine lotion or an antihistamine taken by mouth to ease itching.   A bandage (dressing).  Follow these instructions at home:  Skin care   Moisturize your skin as needed.   Apply cool compresses to the affected areas.   Try applying baking soda paste to your skin. Stir water into baking soda until it reaches a paste-like consistency.   Do not scratch your skin, and avoid friction to the affected area.   Avoid the use of soaps, perfumes, and dyes.  Medicines   Take or apply over-the-counter and prescription medicines only as told by your health care provider.   If you were prescribed an antibiotic medicine, take or apply the antibiotic as told by your health care provider. Do not stop using the antibiotic even if your condition   improves.  Bathing   Try taking a bath with:  ? Epsom salts. Follow the instructions on the packaging. You can get these at your local pharmacy or grocery store.  ? Baking soda. Pour a small amount into the bath as directed by your health care provider.  ? Colloidal oatmeal. Follow the instructions on the packaging. You can get this at your local pharmacy or grocery store.   Bathe less frequently, such as every other day.   Bathe in lukewarm water. Avoid using hot water.  Bandage care   If you were given a bandage (dressing), change it as told by your health care provider.   Wash your hands  with soap and water before and after you change your dressing. If soap and water are not available, use hand sanitizer.  General instructions   Avoid the substance that caused your reaction. If you do not know what caused it, keep a journal to try to track what caused it. Write down:  ? What you eat.  ? What cosmetic products you use.  ? What you drink.  ? What you wear in the affected area. This includes jewelry.   Check the affected areas every day for signs of infection. Check for:  ? More redness, swelling, or pain.  ? More fluid or blood.  ? Warmth.  ? Pus or a bad smell.   Keep all follow-up visits as told by your health care provider. This is important.  Contact a health care provider if:   Your condition does not improve with treatment.   Your condition gets worse.   You have signs of infection such as swelling, tenderness, redness, soreness, or warmth in the affected area.   You have a fever.   You have new symptoms.  Get help right away if:   You have a severe headache, neck pain, or neck stiffness.   You vomit.   You feel very sleepy.   You notice red streaks coming from the affected area.   Your bone or joint underneath the affected area becomes painful after the skin has healed.   The affected area turns darker.   You have difficulty breathing.  Summary   Dermatitis is redness, soreness, and swelling (inflammation) of the skin. Contact dermatitis is a reaction to certain substances that touch the skin.   Symptoms of this condition may occur on your body anywhere the irritant has touched you or is touched by you.   This condition is treated by figuring out what caused the reaction and protecting your skin from further contact. Treatment may also include medicines and skin care.   Avoid the substance that caused your reaction. If you do not know what caused it, keep a journal to try to track what caused it.   Contact a health care provider if your condition gets worse or you have signs  of infection such as swelling, tenderness, redness, soreness, or warmth in the affected area.  This information is not intended to replace advice given to you by your health care provider. Make sure you discuss any questions you have with your health care provider.  Document Released: 05/10/2000 Document Revised: 11/26/2017 Document Reviewed: 11/26/2017  Elsevier Interactive Patient Education  2019 [employer]

## 2019-03-05 ENCOUNTER — Other Ambulatory Visit: Payer: Self-pay

## 2019-03-08 ENCOUNTER — Other Ambulatory Visit: Payer: Self-pay

## 2019-03-08 ENCOUNTER — Encounter: Payer: Self-pay | Admitting: Women's Health

## 2019-03-08 ENCOUNTER — Ambulatory Visit (INDEPENDENT_AMBULATORY_CARE_PROVIDER_SITE_OTHER): Payer: No Typology Code available for payment source | Admitting: Women's Health

## 2019-03-08 VITALS — BP 120/78 | Ht 64.0 in | Wt 123.0 lb

## 2019-03-08 DIAGNOSIS — Z01419 Encounter for gynecological examination (general) (routine) without abnormal findings: Secondary | ICD-10-CM

## 2019-03-08 DIAGNOSIS — Z23 Encounter for immunization: Secondary | ICD-10-CM

## 2019-03-08 DIAGNOSIS — Z13220 Encounter for screening for lipoid disorders: Secondary | ICD-10-CM

## 2019-03-08 NOTE — Progress Notes (Signed)
Susan Lawrence 05-Oct-1980 [id number]    History:    Presents for annual exam.  Regular monthly cycle, rhythm method.  Would like a second pregnancy husband is age 39 and does not want more children.  Normal Pap history.  History of a benign cervical polyp.  Negative breast biopsy in the [place] greater than 15 years ago.  Past medical history, past surgical history, family history and social history were all reviewed and documented in the EPIC chart.  Works at [employer].  Numerous family members with type 2 diabetes, father deceased from heart disease.  Lives with his mother-in-law who is helpful with 41-year-old daughter Susan Lawrence.  History of a benign cervical polyp.  ROS:  A ROS was performed and pertinent positives and negatives are included.  Exam:  Vitals:   03/08/19 0814  BP: 120/78  Weight: 123 lb (55.8 kg)  Height: _0  (1.626 m)   Body mass index is 21.11 kg/m.   General appearance:  Normal Thyroid:  Symmetrical, normal in size, without palpable masses or nodularity. Respiratory  Auscultation:  Clear without wheezing or rhonchi Cardiovascular  Auscultation:  Regular rate, without rubs, murmurs or gallops  Edema/varicosities:  Not grossly evident Abdominal  Soft,nontender, without masses, guarding or rebound.  Liver/spleen:  No organomegaly noted  Hernia:  None appreciated  Skin  Inspection:  Grossly normal   Breasts: Examined lying and sitting.     Right: Without masses, retractions, discharge or axillary adenopathy.     Left: Without masses, retractions, discharge or axillary adenopathy. Gentitourinary   Inguinal/mons:  Normal without inguinal adenopathy  External genitalia:  Normal  BUS/Urethra/Skene's glands:  Normal  Vagina:  Normal  Cervix:  Normal  Uterus:  normal in size, shape and contour.  Midline and mobile  Adnexa/parametria:     Rt: Without masses or tenderness well-healed incision  inner upper breast.   Lt: Without masses or  tenderness.  Anus and perineum: Normal  Digital rectal exam: Normal sphincter tone without palpated masses or tenderness  Assessment/Plan:  38 y.o.MAF G1, P1 for annual exam with no complaints.  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  Continue MVI and vitamin D supplement daily.  SBE's, continue active lifestyle/exercise, calcium rich foods, and self care encouraged.  CBC, glucose, lipid panel, Pap normal 2019, new screening guidelines reviewed.   [place], 8:29 AM 03/08/2019

## 2019-03-08 NOTE — Patient Instructions (Signed)
Health Maintenance, Female Adopting a healthy lifestyle and getting preventive care are important in promoting health and wellness. Ask your health care provider about:  The right schedule for you to have regular tests and exams.  Things you can do on your own to prevent diseases and keep yourself healthy. What should I know about diet, weight, and exercise? Eat a healthy diet   Eat a diet that includes plenty of vegetables, fruits, low-fat dairy products, and lean protein.  Do not eat a lot of foods that are high in solid fats, added sugars, or sodium. Maintain a healthy weight Body mass index (BMI) is used to identify weight problems. It estimates body fat based on height and weight. Your health care provider can help determine your BMI and help you achieve or maintain a healthy weight. Get regular exercise Get regular exercise. This is one of the most important things you can do for your health. Most adults should:  Exercise for at least 150 minutes each week. The exercise should increase your heart rate and make you sweat (moderate-intensity exercise).  Do strengthening exercises at least twice a week. This is in addition to the moderate-intensity exercise.  Spend less time sitting. Even light physical activity can be beneficial. Watch cholesterol and blood lipids Have your blood tested for lipids and cholesterol at 39 years of age, then have this test every 5 years. Have your cholesterol levels checked more often if:  Your lipid or cholesterol levels are high.  You are older than 40 years of age.  You are at high risk for heart disease. What should I know about cancer screening? Depending on your health history and family history, you may need to have cancer screening at various ages. This may include screening for:  Breast cancer.  Cervical cancer.  Colorectal cancer.  Skin cancer.  Lung cancer. What should I know about heart disease, diabetes, and high blood  pressure? Blood pressure and heart disease  High blood pressure causes heart disease and increases the risk of stroke. This is more likely to develop in people who have high blood pressure readings, are of African descent, or are overweight.  Have your blood pressure checked: ? Every 3-5 years if you are 18-39 years of age. ? Every year if you are 40 years old or older. Diabetes Have regular diabetes screenings. This checks your fasting blood sugar level. Have the screening done:  Once every three years after age 40 if you are at a normal weight and have a low risk for diabetes.  More often and at a younger age if you are overweight or have a high risk for diabetes. What should I know about preventing infection? Hepatitis B If you have a higher risk for hepatitis B, you should be screened for this virus. Talk with your health care provider to find out if you are at risk for hepatitis B infection. Hepatitis C Testing is recommended for:  Everyone born from 1945 through 1965.  Anyone with known risk factors for hepatitis C. Sexually transmitted infections (STIs)  Get screened for STIs, including gonorrhea and chlamydia, if: ? You are sexually active and are younger than 39 years of age. ? You are older than 39 years of age and your health care provider tells you that you are at risk for this type of infection. ? Your sexual activity has changed since you were last screened, and you are at increased risk for chlamydia or gonorrhea. Ask your health care provider if   you are at risk.  Ask your health care provider about whether you are at high risk for HIV. Your health care provider may recommend a prescription medicine to help prevent HIV infection. If you choose to take medicine to prevent HIV, you should first get tested for HIV. You should then be tested every 3 months for as long as you are taking the medicine. Pregnancy  If you are about to stop having your period (premenopausal) and  you may become pregnant, seek counseling before you get pregnant.  Take 400 to 800 micrograms (mcg) of folic acid every day if you become pregnant.  Ask for birth control (contraception) if you want to prevent pregnancy. Osteoporosis and menopause Osteoporosis is a disease in which the bones lose minerals and strength with aging. This can result in bone fractures. If you are 65 years old or older, or if you are at risk for osteoporosis and fractures, ask your health care provider if you should:  Be screened for bone loss.  Take a calcium or vitamin D supplement to lower your risk of fractures.  Be given hormone replacement therapy (HRT) to treat symptoms of menopause. Follow these instructions at home: Lifestyle  Do not use any products that contain nicotine or tobacco, such as cigarettes, e-cigarettes, and chewing tobacco. If you need help quitting, ask your health care provider.  Do not use street drugs.  Do not share needles.  Ask your health care provider for help if you need support or information about quitting drugs. Alcohol use  Do not drink alcohol if: ? Your health care provider tells you not to drink. ? You are pregnant, may be pregnant, or are planning to become pregnant.  If you drink alcohol: ? Limit how much you use to 0-1 drink a day. ? Limit intake if you are breastfeeding.  Be aware of how much alcohol is in your drink. In the [place] one drink equals one 12 oz bottle of beer (355 mL), one 5 oz glass of wine (148 mL), or one 1 oz glass of hard liquor (44 mL). General instructions  Schedule regular health, dental, and eye exams.  Stay current with your vaccines.  Tell your health care provider if: ? You often feel depressed. ? You have ever been abused or do not feel safe at home. Summary  Adopting a healthy lifestyle and getting preventive care are important in promoting health and wellness.  Follow your health care provider's instructions about healthy  diet, exercising, and getting tested or screened for diseases.  Follow your health care provider's instructions on monitoring your cholesterol and blood pressure. This information is not intended to replace advice given to you by your health care provider. Make sure you discuss any questions you have with your health care provider. Document Released: 11/26/2010 Document Revised: 05/06/2018 Document Reviewed: 05/06/2018 Elsevier Patient Education  2020 [employer] Inc.  

## 2019-03-09 LAB — CBC WITH DIFFERENTIAL/PLATELET
Absolute Monocytes: 441 cells/uL (ref 200–950)
Basophils Absolute: 70 cells/uL (ref 0–200)
Basophils Relative: 1 %
Eosinophils Absolute: 301 cells/uL (ref 15–500)
Eosinophils Relative: 4.3 %
HCT: 41.2 % (ref 35.0–45.0)
Hemoglobin: 13.4 g/dL (ref 11.7–15.5)
Lymphs Abs: 2268 cells/uL (ref 850–3900)
MCH: 28.1 pg (ref 27.0–33.0)
MCHC: 32.5 g/dL (ref 32.0–36.0)
MCV: 86.4 fL (ref 80.0–100.0)
MPV: 11.7 fL (ref 7.5–12.5)
Monocytes Relative: 6.3 %
Neutro Abs: 3920 cells/uL (ref 1500–7800)
Neutrophils Relative %: 56 %
Platelets: 239 10*3/uL (ref 140–400)
RBC: 4.77 10*6/uL (ref 3.80–5.10)
RDW: 12.5 % (ref 11.0–15.0)
Total Lymphocyte: 32.4 %
WBC: 7 10*3/uL (ref 3.8–10.8)

## 2019-03-09 LAB — LIPID PANEL
Cholesterol: 175 mg/dL (ref <200)
HDL: 47 mg/dL — ABNORMAL LOW (ref >=50)
LDL Cholesterol (Calc): 109 mg/dL (calc) — ABNORMAL HIGH
Non-HDL Cholesterol (Calc): 128 mg/dL (calc) (ref <130)
Total CHOL/HDL Ratio: 3.7 (calc) (ref <5.0)
Triglycerides: 101 mg/dL (ref <150)

## 2019-03-09 LAB — GLUCOSE, RANDOM: Glucose, Bld: 93 mg/dL (ref 65–99)

## 2019-03-10 LAB — URINE CULTURE
MICRO NUMBER:: 979075
Result:: NO GROWTH
SPECIMEN QUALITY:: ADEQUATE

## 2019-03-10 LAB — URINALYSIS, COMPLETE W/RFL CULTURE
Bacteria, UA: NONE SEEN /HPF
Bilirubin Urine: NEGATIVE
Glucose, UA: NEGATIVE
Hgb urine dipstick: NEGATIVE
Hyaline Cast: NONE SEEN /LPF
Ketones, ur: NEGATIVE
Nitrites, Initial: NEGATIVE
Protein, ur: NEGATIVE
Specific Gravity, Urine: 1.019 (ref 1.001–1.03)
pH: 5.5 (ref 5.0–8.0)

## 2019-03-10 LAB — CULTURE INDICATED

## 2019-03-24 NOTE — Telephone Encounter (Signed)
I called patient and per DPR access note on file I Left detailed message that my Chart email returned unread. I read it to her in the voice mail.

## 2020-03-08 ENCOUNTER — Encounter: Payer: Self-pay | Admitting: Nurse Practitioner

## 2020-03-08 ENCOUNTER — Other Ambulatory Visit: Payer: Self-pay

## 2020-03-08 ENCOUNTER — Ambulatory Visit (INDEPENDENT_AMBULATORY_CARE_PROVIDER_SITE_OTHER): Payer: No Typology Code available for payment source | Admitting: Nurse Practitioner

## 2020-03-08 VITALS — BP 122/78 | Ht 64.0 in | Wt 125.0 lb

## 2020-03-08 DIAGNOSIS — Z01419 Encounter for gynecological examination (general) (routine) without abnormal findings: Secondary | ICD-10-CM | POA: Diagnosis not present

## 2020-03-08 LAB — CBC WITH DIFFERENTIAL/PLATELET
Absolute Monocytes: 496 cells/uL (ref 200–950)
Eosinophils Relative: 2.3 %
Monocytes Relative: 7.3 %

## 2020-03-08 NOTE — Progress Notes (Signed)
   Susan Lawrence [birth year] [id number]   History:  39 y.o. G1P1001 presents for annual exam without GYN complaints. Regular monthly cycle, no contraception. Normal pap history. History of benign cervical polyp.   Gynecologic History Patient's last menstrual period was 02/17/2020. Period Cycle (Days): 28 Period Duration (Days): 5 Period Pattern: Regular Menstrual Flow: Moderate Menstrual Control: Maxi pad, Tampon Dysmenorrhea: (!) Mild Dysmenorrhea Symptoms: Cramping Contraception: none Last Pap: 03/03/2018. Results were: normal Last mammogram: Never  Past medical history, past surgical history, family history and social history were all reviewed and documented in the EPIC chart.  ROS:  A ROS was performed and pertinent positives and negatives are included.  Exam:  Vitals:   03/08/20 0804  BP: 122/78  Weight: 125 lb (56.7 kg)  Height: 5\' 4"  (1.626 m)   Body mass index is 21.46 kg/m.  General appearance:  Normal Thyroid:  Symmetrical, normal in size, without palpable masses or nodularity. Respiratory  Auscultation:  Clear without wheezing or rhonchi Cardiovascular  Auscultation:  Regular rate, without rubs, murmurs or gallops  Edema/varicosities:  Not grossly evident Abdominal  Soft,nontender, without masses, guarding or rebound.  Liver/spleen:  No organomegaly noted  Hernia:  None appreciated  Skin  Inspection:  Grossly normal   Breasts: Examined lying and sitting.   Right: Without masses, retractions, discharge or axillary adenopathy.   Left: Without masses, retractions, discharge or axillary adenopathy. Gentitourinary   Inguinal/mons:  Normal without inguinal adenopathy  External genitalia:  Normal  BUS/Urethra/Skene's glands:  Normal  Vagina:  Normal  Cervix:  Ectropion  Uterus:  Anteverted, normal in size, shape and contour.  Midline and mobile  Adnexa/parametria:     Rt: Without masses or tenderness.   Lt: Without masses or tenderness.  Anus and  perineum: Normal  Assessment/Plan:  39 y.o. G1P1001 for annual exam.   Well female exam with routine gynecological exam - Plan: CBC with Differential/Platelet, Comprehensive metabolic panel, Lipid panel. Education provided on SBEs, importance of preventative screenings, current guidelines, high calcium diet, regular exercise, and multivitamin daily.   Screening for cervical cancer - normal pap history. Will repeat at 5-year interval per guidelines.   Screening for breast cancer - Has not had screening mammogram. She is scheduled for one through her work at the end of the month. Normal breast exam today.   Follow up in 1 year for annual       [address] [facility], 8:14 AM 03/08/2020

## 2020-03-08 NOTE — Patient Instructions (Addendum)
Health Maintenance, Female Adopting a healthy lifestyle and getting preventive care are important in promoting health and wellness. Ask your health care provider about:  The right schedule for you to have regular tests and exams.  Things you can do on your own to prevent diseases and keep yourself healthy. What should I know about diet, weight, and exercise? Eat a healthy diet   Eat a diet that includes plenty of vegetables, fruits, low-fat dairy products, and lean protein.  Do not eat a lot of foods that are high in solid fats, added sugars, or sodium. Maintain a healthy weight Body mass index (BMI) is used to identify weight problems. It estimates body fat based on height and weight. Your health care provider can help determine your BMI and help you achieve or maintain a healthy weight. Get regular exercise Get regular exercise. This is one of the most important things you can do for your health. Most adults should:  Exercise for at least 150 minutes each week. The exercise should increase your heart rate and make you sweat (moderate-intensity exercise).  Do strengthening exercises at least twice a week. This is in addition to the moderate-intensity exercise.  Spend less time sitting. Even light physical activity can be beneficial. Watch cholesterol and blood lipids Have your blood tested for lipids and cholesterol at 40 years of age, then have this test every 5 years. Have your cholesterol levels checked more often if:  Your lipid or cholesterol levels are high.  You are older than 40 years of age.  You are at high risk for heart disease. What should I know about cancer screening? Depending on your health history and family history, you may need to have cancer screening at various ages. This may include screening for:  Breast cancer.  Cervical cancer.  Colorectal cancer.  Skin cancer.  Lung cancer. What should I know about heart disease, diabetes, and high blood  pressure? Blood pressure and heart disease  High blood pressure causes heart disease and increases the risk of stroke. This is more likely to develop in people who have high blood pressure readings, are of African descent, or are overweight.  Have your blood pressure checked: ? Every 3-5 years if you are 18-39 years of age. ? Every year if you are 40 years old or older. Diabetes Have regular diabetes screenings. This checks your fasting blood sugar level. Have the screening done:  Once every three years after age 40 if you are at a normal weight and have a low risk for diabetes.  More often and at a younger age if you are overweight or have a high risk for diabetes. What should I know about preventing infection? Hepatitis B If you have a higher risk for hepatitis B, you should be screened for this virus. Talk with your health care provider to find out if you are at risk for hepatitis B infection. Hepatitis C Testing is recommended for:  Everyone born from 1945 through 1965.  Anyone with known risk factors for hepatitis C. Sexually transmitted infections (STIs)  Get screened for STIs, including gonorrhea and chlamydia, if: ? You are sexually active and are younger than 40 years of age. ? You are older than 40 years of age and your health care provider tells you that you are at risk for this type of infection. ? Your sexual activity has changed since you were last screened, and you are at increased risk for chlamydia or gonorrhea. Ask your health care provider if   you are at risk.  Ask your health care provider about whether you are at high risk for HIV. Your health care provider may recommend a prescription medicine to help prevent HIV infection. If you choose to take medicine to prevent HIV, you should first get tested for HIV. You should then be tested every 3 months for as long as you are taking the medicine. Pregnancy  If you are about to stop having your period (premenopausal) and  you may become pregnant, seek counseling before you get pregnant.  Take 400 to 800 micrograms (mcg) of folic acid every day if you become pregnant.  Ask for birth control (contraception) if you want to prevent pregnancy. Osteoporosis and menopause Osteoporosis is a disease in which the bones lose minerals and strength with aging. This can result in bone fractures. If you are 65 years old or older, or if you are at risk for osteoporosis and fractures, ask your health care provider if you should:  Be screened for bone loss.  Take a calcium or vitamin D supplement to lower your risk of fractures.  Be given hormone replacement therapy (HRT) to treat symptoms of menopause. Follow these instructions at home: Lifestyle  Do not use any products that contain nicotine or tobacco, such as cigarettes, e-cigarettes, and chewing tobacco. If you need help quitting, ask your health care provider.  Do not use street drugs.  Do not share needles.  Ask your health care provider for help if you need support or information about quitting drugs. Alcohol use  Do not drink alcohol if: ? Your health care provider tells you not to drink. ? You are pregnant, may be pregnant, or are planning to become pregnant.  If you drink alcohol: ? Limit how much you use to 0-1 drink a day. ? Limit intake if you are breastfeeding.  Be aware of how much alcohol is in your drink. In the [place] one drink equals one 12 oz bottle of beer (355 mL), one 5 oz glass of wine (148 mL), or one 1 oz glass of hard liquor (44 mL). General instructions  Schedule regular health, dental, and eye exams.  Stay current with your vaccines.  Tell your health care provider if: ? You often feel depressed. ? You have ever been abused or do not feel safe at home. Summary  Adopting a healthy lifestyle and getting preventive care are important in promoting health and wellness.  Follow your health care provider's instructions about healthy  diet, exercising, and getting tested or screened for diseases.  Follow your health care provider's instructions on monitoring your cholesterol and blood pressure. This information is not intended to replace advice given to you by your health care provider. Make sure you discuss any questions you have with your health care provider. Document Revised: 05/06/2018 Document Reviewed: 05/06/2018 Elsevier Patient Education  2020 [employer]  

## 2020-03-09 LAB — CBC WITH DIFFERENTIAL/PLATELET
Basophils Absolute: 41 cells/uL (ref 0–200)
Basophils Relative: 0.6 %
Eosinophils Absolute: 156 cells/uL (ref 15–500)
HCT: 38.5 % (ref 35.0–45.0)
Hemoglobin: 12.7 g/dL (ref 11.7–15.5)
Lymphs Abs: 1958 cells/uL (ref 850–3900)
MCH: 28.1 pg (ref 27.0–33.0)
MCHC: 33 g/dL (ref 32.0–36.0)
MCV: 85.2 fL (ref 80.0–100.0)
MPV: 11.8 fL (ref 7.5–12.5)
Neutro Abs: 4148 cells/uL (ref 1500–7800)
Neutrophils Relative %: 61 %
Platelets: 220 10*3/uL (ref 140–400)
RBC: 4.52 10*6/uL (ref 3.80–5.10)
RDW: 13 % (ref 11.0–15.0)
Total Lymphocyte: 28.8 %
WBC: 6.8 10*3/uL (ref 3.8–10.8)

## 2020-03-09 LAB — COMPREHENSIVE METABOLIC PANEL
AG Ratio: 1.5 (calc) (ref 1.0–2.5)
ALT: 12 U/L (ref 6–29)
AST: 16 U/L (ref 10–30)
Albumin: 4.5 g/dL (ref 3.6–5.1)
Alkaline phosphatase (APISO): 39 U/L (ref 31–125)
BUN: 11 mg/dL (ref 7–25)
CO2: 24 mmol/L (ref 20–32)
Calcium: 9.6 mg/dL (ref 8.6–10.2)
Chloride: 105 mmol/L (ref 98–110)
Creat: 0.74 mg/dL (ref 0.50–1.10)
Globulin: 3 g/dL (calc) (ref 1.9–3.7)
Glucose, Bld: 91 mg/dL (ref 65–99)
Potassium: 3.6 mmol/L (ref 3.5–5.3)
Sodium: 139 mmol/L (ref 135–146)
Total Bilirubin: 0.7 mg/dL (ref 0.2–1.2)
Total Protein: 7.5 g/dL (ref 6.1–8.1)

## 2020-03-09 LAB — LIPID PANEL
Cholesterol: 195 mg/dL (ref <200)
HDL: 47 mg/dL — ABNORMAL LOW (ref >=50)
LDL Cholesterol (Calc): 127 mg/dL (calc) — ABNORMAL HIGH
Non-HDL Cholesterol (Calc): 148 mg/dL (calc) — ABNORMAL HIGH (ref <130)
Total CHOL/HDL Ratio: 4.1 (calc) (ref <5.0)
Triglycerides: 105 mg/dL (ref <150)

## 2020-04-27 ENCOUNTER — Telehealth: Payer: Self-pay | Admitting: *Deleted

## 2020-04-27 DIAGNOSIS — L309 Dermatitis, unspecified: Secondary | ICD-10-CM

## 2020-04-27 MED ORDER — TRIAMCINOLONE ACETONIDE 0.1 % EX CREA: 1.0000 "application " | TOPICAL_CREAM | Freq: Two times a day (BID) | CUTANEOUS | 0 refills | Status: DC

## 2020-04-27 NOTE — Telephone Encounter (Signed)
Refill request Triamcinolone for contact dermatitis to hands that she was seen and treated for in clinic in Feb 2020. Uses intermittently prn. Brings empty tube to clinic.

## 2020-04-28 ENCOUNTER — Encounter: Payer: Self-pay | Admitting: *Deleted

## 2020-04-28 NOTE — Telephone Encounter (Signed)
PRN use not using frequently working well for patient.  Electronic Rx signed and [facility] faxed request to clarify if cream or ointment called pharmacy staff and verified cream preferred.  Reviewed RN Rolly Salter note  Patient to follow up if new or worsening symptoms for re-evaluation.

## 2020-12-25 ENCOUNTER — Telehealth: Payer: Self-pay | Admitting: *Deleted

## 2020-12-25 ENCOUNTER — Encounter: Payer: Self-pay | Admitting: *Deleted

## 2020-12-25 DIAGNOSIS — Z20822 Contact with and (suspected) exposure to covid-19: Secondary | ICD-10-CM

## 2020-12-25 NOTE — Telephone Encounter (Signed)
Reviewed RN Haley note agreed with plan of care. 

## 2020-12-25 NOTE — Telephone Encounter (Signed)
Taskforce notified clinic that pt and spouse traveled internationally and returned stateside 7/23. They were notified by HR to quarantine 5 days from return date, as they are one month overdue for booster and test for covid days 3-5 after return. Spouse has tested positive on 7/29. Pt has not yet tested. Pt is asymptomatic and spouse is quarantining in separate bedroom and bathroom.  Test scheduled for pt for 8/2 at 10:30 [facility] [address]. Since she is past her 5 days quarantine from return to the [place], and quarantining separate from positive spouse, she will only need to test. If negative and remains asymptomatic, she may RTW 8/3. If positive, will start quarantine based on test date.  Recommended frequent cleaning of high touch surfaces, continuing current quarantine set up. Will f/u with pt tomorrow for results and will encourage a booster vaccine since she is eligible for 1st booster.

## 2020-12-26 NOTE — Telephone Encounter (Signed)
Reviewed RN Rolly Salter note positive covid test continues asymptomatic quarantine dates updated agreed with plan of care.

## 2020-12-26 NOTE — Telephone Encounter (Signed)
No email from [facility] yet but was supposed to be available by 1300. Called pharmacy with pt on line as well and received results as positive.   Day 0 8/2 Day 5 8/7 RTW 8/8 with strict mask use thru 8/12  Pt remains asymptomatic.

## 2020-12-28 NOTE — Telephone Encounter (Signed)
Spoke with pt by phone. She reports still asymptomatic. Feels well. Ready to come back to work. RTW 8/8.

## 2020-12-28 NOTE — Telephone Encounter (Signed)
Noted Reviewed RN Rolly Salter note patient  continues asymptomatic/quarantine and ready to return onsite 8/8

## 2021-01-01 NOTE — Telephone Encounter (Signed)
Confirmed with pt by phone that she did RTW today 8/8 as expected with strict mask use thru 8/12. She denies any sx, questions or concerns.

## 2021-01-01 NOTE — Telephone Encounter (Signed)
Reviewed RN Rolly Salter note patient returned onsite as expected today continue strict mask wear.

## 2021-01-27 NOTE — Telephone Encounter (Signed)
Patient contacted via telephone feeling well no questions or concerns since return to work.  A&Ox3 spoke full sentences without difficulty; no cough/congestion or shortness of breath audible during 2 minute telephone call.  Encounter closed.

## 2021-06-13 ENCOUNTER — Other Ambulatory Visit: Payer: Self-pay | Admitting: Obstetrics & Gynecology

## 2021-06-13 DIAGNOSIS — Z1231 Encounter for screening mammogram for malignant neoplasm of breast: Secondary | ICD-10-CM

## 2021-06-18 ENCOUNTER — Ambulatory Visit
Admission: RE | Admit: 2021-06-18 | Discharge: 2021-06-18 | Disposition: A | Discharge status: Home / Self Care | Payer: No Typology Code available for payment source | Source: Ambulatory Visit | Attending: Obstetrics & Gynecology | Admitting: Obstetrics & Gynecology

## 2021-06-18 ENCOUNTER — Other Ambulatory Visit: Payer: Self-pay

## 2021-06-18 DIAGNOSIS — Z1231 Encounter for screening mammogram for malignant neoplasm of breast: Secondary | ICD-10-CM

## 2021-07-19 ENCOUNTER — Encounter: Payer: Self-pay | Admitting: Registered Nurse

## 2021-07-19 ENCOUNTER — Ambulatory Visit: Payer: Self-pay | Admitting: Registered Nurse

## 2021-07-19 VITALS — BP 111/72 | HR 77 | Temp 99.0°F

## 2021-07-19 DIAGNOSIS — L299 Pruritus, unspecified: Secondary | ICD-10-CM

## 2021-07-19 DIAGNOSIS — L209 Atopic dermatitis, unspecified: Secondary | ICD-10-CM

## 2021-07-19 MED ORDER — CETIRIZINE HCL 10 MG PO TABS: 10.0000 mg | ORAL_TABLET | Freq: Two times a day (BID) | ORAL | 1 refills | Status: DC | PRN

## 2021-07-19 MED ORDER — PREDNISONE 10 MG PO TABS: ORAL_TABLET | ORAL | 0 refills | Status: DC

## 2021-07-19 MED ORDER — AQUAPHOR EX OINT: TOPICAL_OINTMENT | CUTANEOUS | 0 refills | Status: DC | PRN

## 2021-07-19 NOTE — Progress Notes (Signed)
Subjective:    Patient ID: Susan Lawrence, female    DOB: 11/05/1980, 41 y.o.   MRN: [id number]  40y/o established female pt c/o rash to face x2 weeks. Unknown trigger/cause. Rash is itchy, nonpainful. Rough to the touch per pt. Has been using calagel that helped a bit but itching returns after a few minutes. Rash is now spreading. Started on cheeks and now present on full face except mid forehead and chin. Using oil of olay moisturizer daily unsure if retinol in product.  Using baby formula soap for face.  Started wearing face mask to cover rash at work only.  Has tried cloth and throw away masks has not noticed any improvement with either type of mask.  Denied changes in personal hygiene/laundry products.  Hands with some dry skin also notes worsens when washing hands in hot water or dishes prn.  Has dishwasher at home.  Denied dyspnea/wheezing/n/v/d/tongue swelling/headaches/dysphagia/dysphasia.     Review of Systems  Constitutional:  Negative for activity change, appetite change, chills, diaphoresis, fatigue and fever.  HENT:  Positive for facial swelling. Negative for congestion, mouth sores, trouble swallowing and voice change.   Eyes:  Negative for photophobia, pain, discharge, redness, itching and visual disturbance.  Respiratory:  Negative for cough, shortness of breath, wheezing and stridor.   Cardiovascular:  Negative for chest pain.  Gastrointestinal:  Negative for diarrhea, nausea and vomiting.  Endocrine: Negative for cold intolerance and heat intolerance.  Genitourinary:  Negative for difficulty urinating.  Musculoskeletal:  Negative for back pain, gait problem, neck pain and neck stiffness.  Skin:  Positive for color change and rash. Negative for pallor and wound.  Allergic/Immunologic: Negative for food allergies.  Neurological:  Negative for dizziness, tremors, seizures, syncope, facial asymmetry, speech difficulty, weakness, light-headedness, numbness and headaches.   Hematological:  Negative for adenopathy. Does not bruise/bleed easily.  Psychiatric/Behavioral:  Negative for agitation, confusion and sleep disturbance.       Objective:   Physical Exam Vitals and nursing note reviewed.  Constitutional:      General: She is awake. She is not in acute distress.    Appearance: Normal appearance. She is well-developed, well-groomed and normal weight. She is not ill-appearing, toxic-appearing or diaphoretic.  HENT:     Head: Normocephalic and atraumatic.     Jaw: There is normal jaw occlusion. No trismus, tenderness, swelling or pain on movement.     Salivary Glands: Right salivary gland is not diffusely enlarged or tender. Left salivary gland is not diffusely enlarged or tender.     Right Ear: Hearing and external ear normal.     Left Ear: Hearing and external ear normal.     Nose: Nose normal. No congestion or rhinorrhea.     Right Turbinates: Not enlarged, swollen or pale.     Left Turbinates: Not enlarged, swollen or pale.     Right Sinus: No maxillary sinus tenderness or frontal sinus tenderness.     Left Sinus: No maxillary sinus tenderness or frontal sinus tenderness.     Mouth/Throat:     Lips: Pink. No lesions.     Mouth: Mucous membranes are moist. No angioedema.     Dentition: No dental abscesses or gum lesions.     Tongue: No lesions. Tongue does not deviate from midline.     Palate: No mass and lesions.     Pharynx: Oropharynx is clear. Uvula midline. No uvula swelling.     Tonsils: No tonsillar exudate.  Eyes:  General: Lids are normal. Vision grossly intact. Gaze aligned appropriately. No allergic shiner or scleral icterus.       Right eye: No discharge.        Left eye: No discharge.     Extraocular Movements: Extraocular movements intact.     Conjunctiva/sclera: Conjunctivae normal.     Pupils: Pupils are equal, round, and reactive to light.  Neck:     Trachea: Trachea and phonation normal. No tracheal deviation.   Cardiovascular:     Rate and Rhythm: Normal rate and regular rhythm.     Pulses: Normal pulses.          Radial pulses are 2+ on the right side and 2+ on the left side.  Pulmonary:     Effort: Pulmonary effort is normal. No respiratory distress.     Breath sounds: Normal breath sounds and air entry. No stridor, decreased air movement or transmitted upper airway sounds. No decreased breath sounds, wheezing or rales.     Comments: Spoke full sentences without difficulty; no cough observed in exam room Abdominal:     General: Abdomen is flat.  Musculoskeletal:        General: Normal range of motion.     Cervical back: Normal range of motion and neck supple. No swelling, edema, deformity, erythema, signs of trauma, lacerations, rigidity, torticollis, tenderness or crepitus. No pain with movement. Normal range of motion.  Lymphadenopathy:     Head:     Right side of head: No submandibular or preauricular adenopathy.     Left side of head: No submandibular or preauricular adenopathy.     Cervical: No cervical adenopathy.     Right cervical: No superficial cervical adenopathy.    Left cervical: No superficial cervical adenopathy.  Skin:    General: Skin is warm and dry.     Capillary Refill: Capillary refill takes less than 2 seconds.     Coloration: Skin is not ashen, cyanotic, jaundiced, mottled, pale or sallow.     Findings: Abrasion and rash present. No abscess, bruising, burn, ecchymosis, erythema, signs of injury, laceration, lesion, petechiae or wound. Rash is macular, papular and scaling. Rash is not crusting, nodular, purpuric, pustular, urticarial or vesicular.     Nails: There is no clubbing.          Comments: Cheeks annular lesions less than 1cm diameter that become confluent at overlapping borders; dry macular papular rash and fine scaling face cheeks/chin/nose/lips (mask pattern) and dorsum right hand primarily overlying 3rd MCP joint cracked calloused erythema macular with a  few papules  Neurological:     General: No focal deficit present.     Mental Status: She is alert and oriented to person, place, and time. Mental status is at baseline.     GCS: GCS eye subscore is 4. GCS verbal subscore is 5. GCS motor subscore is 6.     Cranial Nerves: Cranial nerves 2-12 are intact. No cranial nerve deficit, dysarthria or facial asymmetry.     Sensory: Sensation is intact.     Motor: Motor function is intact. No weakness, tremor, atrophy, abnormal muscle tone or seizure activity.     Coordination: Coordination is intact. Coordination normal.     Gait: Gait is intact. Gait normal.     Comments: In/out of chair and on/off exam table without difficulty; gait sure and steady in clinic; bilateral hand grasp equal 5/5  Psychiatric:        Attention and Perception: Attention and perception normal.  Mood and Affect: Mood and affect normal.        Speech: Speech normal.        Behavior: Behavior normal. Behavior is cooperative.        Thought Content: Thought content normal.        Cognition and Memory: Cognition and memory normal.        Judgment: Judgment normal.          Assessment & Plan:   A-atopic dermatitis bilateral hands and pruritic dermatitis facial   P-Medication as directed. Starting prednisone taper for facial dermatitis will help with hand flare atopic dermatitis also.  Daily emollient e.g. Vaseline/aquaphor/cocoa butter.  Given 4 UD aquaphor from clinic stock.   Avoid hot steamy showers.  Wear gloves if washing dishes in sink.  Discussed for daily handwashing ensure tepid to cool water.  Apply water first then soap/alternate hands getting soap from dispenser.  Ensure thoroughly rinses soap from skin.  Apply emollient prn during the day and especially after bathing to lock in moisture.  Symptomatic therapy suggested. Warm to cool water soaks and/or oatmeal baths. Call or return to clinic as needed if these symptoms worsen or fail to improve as anticipated.   Re-evaluation in 1 week.   Exitcare handout on atopic dermatitis printed and given to patient. Patient verbalized agreement and understanding of treatment plan and had no further questions at this time.    DDx contact dermatitis/atopic dermatitis  Prednisone 10mg  po daily with breakfast (20mg  x 7 days, 10mg   x 7days) #21 RF0 dispensed from [facility].  Discussed possible side effects e.g. insomnia/increased or decreased appetite, increased blood pressure/blood sugar/heart rate.  Generic zyrtec 10mg  po BID prn itching.  Will follow up in 1 week for re-evaluation and dispense second bottle prednisone after determining if tolerating prednisone and rash response.  Avoid scratching.  Given 7 surgical masks from clinic stock.  Wear 1 daily and throw away do not reuse after 24 hours.  Discussed mask wear may be worsening rash along with home moisturizer if retinol (peeling agent) in it.  Start noncomedogenic skin moisturizer without retin A/retinol/antiaging cream formula.  Daily emollient e.g. Vaseline/aquaphor/cocoa butter.  Given 4 UD aquaphor from clinic stock.   Avoid hot steamy showers. If not needing to wear mask at home to protect self leave skin uncovered.   Ensure thoroughly rinses soap from skin.  Apply emollient prn during the day and especially after bathing to lock in moisture.  Symptomatic therapy suggested. Warm to cool water soaks and/or oatmeal baths. May continue calagel BID prn itching topical also.  Call or return to clinic as needed if these symptoms worsen or fail to improve as anticipated.  Re-evaluation in 1 week.   Exitcare handout on pruritis/contact dermatitis printed and given to patient. Patient verbalized agreement and understanding of treatment plan and had no further questions at this time.

## 2021-07-19 NOTE — Patient Instructions (Signed)
Pruritus Pruritus is an itchy feeling on the skin. One of the most common causes is dry skin, but many different things can cause itching. Most cases of itching do not require medical attention. Sometimes itchy skin can turn into a rash. Follow these instructions at home: Skin care  Apply moisturizing lotion to your skin as needed. Lotion that contains petroleum jelly is best. Take medicines or apply medicated creams only as told by your health care provider. This may include: Corticosteroid cream. Anti-itch lotions. Oral antihistamines. Apply a cool, wet cloth (cool compress) to the affected areas. Take baths with one of the following: Epsom salts. You can get these at your local pharmacy or grocery store. Follow the instructions on the packaging. Baking soda. Pour a small amount into the bath as told by your health care provider. Colloidal oatmeal. You can get this at your local pharmacy or grocery store. Follow the instructions on the packaging. Apply baking soda paste to your skin. To make the paste, stir water into a small amount of baking soda until it reaches a paste-like consistency. Do not scratch your skin. Do not take hot showers or baths, which can make itching worse. A cool shower may help with itching as long as you apply moisturizing lotion after the shower. Do not use scented soaps, detergents, perfumes, and cosmetic products. Instead, use gentle, unscented versions of these items. General instructions Avoid wearing tight clothes. Keep a journal to help find out what is causing your itching. Write down: What you eat and drink. What cosmetic products you use. What soaps or detergents you use. What you wear, including jewelry. Use a humidifier. This keeps the air moist, which helps to prevent dry skin. Be aware of any changes in your itchiness. Contact a health care provider if: The itching does not go away after several days. You are unusually thirsty or urinating more  than normal. Your skin tingles or feels numb. Your skin or the white parts of your eyes turn yellow (jaundice). You feel weak. You have any of the following: Night sweats. Tiredness (fatigue). Weight loss. Abdominal pain. Summary Pruritus is an itchy feeling on the skin. One of the most common causes is dry skin, but many different conditions and factors can cause itching. Apply moisturizing lotion to your skin as needed. Lotion that contains petroleum jelly is best. Take medicines or apply medicated creams only as told by your health care provider. Do not take hot showers or baths. Do not use scented soaps, detergents, perfumes, or cosmetic products. This information is not intended to replace advice given to you by your health care provider. Make sure you discuss any questions you have with your health care provider. Document Revised: 05/27/2017 Document Reviewed: 05/27/2017 Elsevier Patient Education  2022 [place] Dermatitis Dermatitis is redness, soreness, and swelling (inflammation) of the skin. Contact dermatitis is a reaction to certain substances that touch the skin. Many different substances can cause contact dermatitis. There are two types of contact dermatitis: Irritant contact dermatitis. This type is caused by something that irritates your skin, such as having dry hands from washing them too often with soap. This type does not require previous exposure to the substance for a reaction to occur. This is the most common type. Allergic contact dermatitis. This type is caused by a substance that you are allergic to, such as poison ivy. This type occurs when you have been exposed to the substance (allergen) and develop a sensitivity to it. Dermatitis may develop  soon after your first exposure to the allergen, or it may not develop until the next time you are exposed and every time thereafter. What are the causes? Irritant contact dermatitis is most commonly caused by  exposure to: Makeup. Soaps. Detergents. Bleaches. Acids. Metal salts, such as nickel. Allergic contact dermatitis is most commonly caused by exposure to: Poisonous plants. Chemicals. Jewelry. Latex. Medicines. Preservatives in products, such as clothing. What increases the risk? You are more likely to develop this condition if you have: A job that exposes you to irritants or allergens. Certain medical conditions, such as asthma or eczema. What are the signs or symptoms? Symptoms of this condition may occur on your body anywhere the irritant has touched you or is touched by you. Symptoms include: Dryness or flaking. Redness. Cracks. Itching. Pain or a burning feeling. Blisters. Drainage of small amounts of blood or clear fluid from skin cracks. With allergic contact dermatitis, there may also be swelling in areas such as the eyelids, mouth, or genitals. How is this diagnosed? This condition is diagnosed with a medical history and physical exam. A patch skin test may be performed to help determine the cause. If the condition is related to your job, you may need to see an occupational medicine specialist. How is this treated? This condition is treated by checking for the cause of the reaction and protecting your skin from further contact. Treatment may also include: Steroid creams or ointments. Oral steroid medicines may be needed in more severe cases. Antibiotic medicines or antibacterial ointments, if a skin infection is present. Antihistamine lotion or an antihistamine taken by mouth to ease itching. A bandage (dressing). Follow these instructions at home: Skin care Moisturize your skin as needed. Apply cool compresses to the affected areas. Try applying baking soda paste to your skin. Stir water into baking soda until it reaches a paste-like consistency. Do not scratch your skin, and avoid friction to the affected area. Avoid the use of soaps, perfumes, and  dyes. Medicines Take or apply over-the-counter and prescription medicines only as told by your health care provider. If you were prescribed an antibiotic medicine, take or apply the antibiotic as told by your health care provider. Do not stop using the antibiotic even if your condition improves. Bathing Try taking a bath with: Epsom salts. Follow the instructions on the packaging. You can get these at your local pharmacy or grocery store. Baking soda. Pour a small amount into the bath as directed by your health care provider. Colloidal oatmeal. Follow the instructions on the packaging. You can get this at your local pharmacy or grocery store. Bathe less frequently, such as every other day. Bathe in lukewarm water. Avoid using hot water. Bandage care If you were given a bandage (dressing), change it as told by your health care provider. Wash your hands with soap and water before and after you change your dressing. If soap and water are not available, use hand sanitizer. General instructions Avoid the substance that caused your reaction. If you do not know what caused it, keep a journal to try to track what caused it. Write down: What you eat. What cosmetic products you use. What you drink. What you wear in the affected area. This includes jewelry. Check the affected areas every day for signs of infection. Check for: More redness, swelling, or pain. More fluid or blood. Warmth. Pus or a bad smell. Keep all follow-up visits as told by your health care provider. This is important. Contact a health  care provider if: Your condition does not improve with treatment. Your condition gets worse. You have signs of infection such as swelling, tenderness, redness, soreness, or warmth in the affected area. You have a fever. You have new symptoms. Get help right away if: You have a severe headache, neck pain, or neck stiffness. You vomit. You feel very sleepy. You notice red streaks coming from  the affected area. Your bone or joint underneath the affected area becomes painful after the skin has healed. The affected area turns darker. You have difficulty breathing. Summary Dermatitis is redness, soreness, and swelling (inflammation) of the skin. Contact dermatitis is a reaction to certain substances that touch the skin. Symptoms of this condition may occur on your body anywhere the irritant has touched you or is touched by you. This condition is treated by figuring out what caused the reaction and protecting your skin from further contact. Treatment may also include medicines and skin care. Avoid the substance that caused your reaction. If you do not know what caused it, keep a journal to try to track what caused it. Contact a health care provider if your condition gets worse or you have signs of infection such as swelling, tenderness, redness, soreness, or warmth in the affected area. This information is not intended to replace advice given to you by your health care provider. Make sure you discuss any questions you have with your health care provider. Document Revised: 09/02/2018 Document Reviewed: 11/26/2017 Elsevier Patient Education  2022 [place]. Atopic Dermatitis Atopic dermatitis is a skin disorder that causes inflammation of the skin. It is marked by a red rash and itchy, dry, scaly skin. It is the most common type of eczema. Eczema is a group of skin conditions that cause the skin to become rough and swollen. This condition is generally worse during the cooler winter months and often improves during the warm summer months. Atopic dermatitis usually starts showing signs in infancy and can last through adulthood. This condition cannot be passed from one person to another (is not contagious). Atopic dermatitis may not always be present, but when it is, it is called a flare-up. What are the causes? The exact cause of this condition is not known. Flare-ups may be triggered  by: Coming in contact with something that you are sensitive or allergic to (allergen). Stress. Certain foods. Extremely hot or cold weather. Harsh chemicals and soaps. Dry air. Chlorine. What increases the risk? This condition is more likely to develop in people who have a personal or family history of: Eczema. Allergies. Asthma. Hay fever. What are the signs or symptoms? Symptoms of this condition include: Dry, scaly skin. Red, itchy rash. Itchiness, which can be severe. This may occur before the skin rash. This can make sleeping difficult. Skin thickening and cracking that can occur over time. How is this diagnosed? This condition is diagnosed based on: Your symptoms. Your medical history. A physical exam. How is this treated? There is no cure for this condition, but symptoms can usually be controlled. Treatment focuses on: Controlling the itchiness and scratching. You may be given medicines, such as antihistamines or steroid creams. Limiting exposure to allergens. Recognizing situations that cause stress and developing a plan to manage stress. If your atopic dermatitis does not get better with medicines, or if it is all over your body (widespread), a treatment using a specific type of light (phototherapy) may be used. Follow these instructions at home: Skin care  Keep your skin well moisturized. Doing this  seals in moisture and helps to prevent dryness. Use unscented lotions that have petroleum in them. Avoid lotions that contain alcohol or water. They can dry the skin. Keep baths or showers short (less than 5 minutes) in warm water. Do not use hot water. Use mild, unscented cleansers for bathing. Avoid soap and bubble bath. Apply a moisturizer to your skin right after a bath or shower. Do not apply anything to your skin without checking with your health care provider. General instructions Take or apply over-the-counter and prescription medicines only as told by your  health care provider. Dress in clothes made of cotton or cotton blends. Dress lightly because heat increases itchiness. When washing your clothes, rinse your clothes twice so all of the soap is removed. Avoid any triggers that can cause a flare-up. Keep your fingernails cut short. Avoid scratching. Scratching makes the rash and itchiness worse. A break in the skin from scratching could result in a skin infection (impetigo). Do not be around people who have cold sores or fever blisters. If you get the infection, it may cause your atopic dermatitis to worsen. Keep all follow-up visits. This is important. Contact a health care provider if: Your itchiness interferes with sleep. Your rash gets worse or is not better within one week of starting treatment. You have a fever. You have a rash flare-up after having contact with someone who has cold sores or fever blisters. Get help right away if: You develop pus or soft yellow scabs in the rash area. Summary Atopic dermatitis causes a red rash and itchy, dry, scaly skin. Treatment focuses on controlling the itchiness and scratching, limiting exposure to things that you are sensitive or allergic to (allergens), recognizing situations that cause stress, and developing a plan to manage stress. Keep your skin well moisturized. Keep baths or showers shorter than 5 minutes and use warm water. Do not use hot water. This information is not intended to replace advice given to you by your health care provider. Make sure you discuss any questions you have with your health care provider. Document Revised: 02/21/2020 Document Reviewed: 02/21/2020 Elsevier Patient Education  2022 [employer].

## 2021-07-26 ENCOUNTER — Ambulatory Visit: Payer: Self-pay | Admitting: Registered Nurse

## 2021-07-26 ENCOUNTER — Encounter: Payer: Self-pay | Admitting: Registered Nurse

## 2021-07-26 VITALS — BP 112/73 | HR 68 | Temp 98.2°F

## 2021-07-26 DIAGNOSIS — L209 Atopic dermatitis, unspecified: Secondary | ICD-10-CM

## 2021-07-26 DIAGNOSIS — L299 Pruritus, unspecified: Secondary | ICD-10-CM

## 2021-07-26 NOTE — Progress Notes (Signed)
Subjective:    Patient ID: Susan Lawrence, female    DOB: 05/18/1981, 41 y.o.   MRN: [id number]  40y/o established female pt presenting for f/u of rash face and hands last office visit 07/19/21. Reports it is much improved, itching tolerable not needing antihistamine use. Only a few small patches of dryness, redness on face. Tolerating low dose prednisone taper without difficulty no headache/insomnia/appetite changes.  Patient applying petroleum jelly daily to hands and face.  Not needing calagel the past couple of days.  Not wearing mask at work any longer since rash has improved.     Review of Systems  Constitutional:  Negative for activity change, appetite change, chills, diaphoresis, fatigue and fever.  HENT:  Negative for trouble swallowing and voice change.   Eyes:  Negative for photophobia and visual disturbance.  Respiratory:  Negative for cough, shortness of breath, wheezing and stridor.   Cardiovascular:  Negative for chest pain and palpitations.  Gastrointestinal:  Negative for abdominal pain, diarrhea, nausea and vomiting.  Endocrine: Negative for cold intolerance and heat intolerance.  Genitourinary:  Negative for difficulty urinating.  Musculoskeletal:  Negative for gait problem, neck pain and neck stiffness.  Skin:  Positive for rash. Negative for pallor and wound.  Allergic/Immunologic: Negative for food allergies.  Neurological:  Negative for dizziness, tremors, seizures, syncope, facial asymmetry, speech difficulty, weakness, light-headedness, numbness and headaches.  Hematological:  Negative for adenopathy. Does not bruise/bleed easily.  Psychiatric/Behavioral:  Negative for agitation, confusion and sleep disturbance.       Objective:   Physical Exam Vitals and nursing note reviewed.  Constitutional:      General: She is awake. She is not in acute distress.    Appearance: Normal appearance. She is well-developed, well-groomed and normal weight. She is not  ill-appearing, toxic-appearing or diaphoretic.  HENT:     Head: Normocephalic and atraumatic.     Jaw: There is normal jaw occlusion.     Salivary Glands: Right salivary gland is not diffusely enlarged. Left salivary gland is not diffusely enlarged.     Right Ear: Hearing and external ear normal.     Left Ear: Hearing and external ear normal.     Nose: Nose normal. No congestion or rhinorrhea.     Mouth/Throat:     Lips: Pink. No lesions.     Mouth: Mucous membranes are moist. No oral lesions or angioedema.     Pharynx: Oropharynx is clear. Uvula midline. No uvula swelling.  Eyes:     General: Lids are normal. Vision grossly intact. Gaze aligned appropriately. No scleral icterus.       Right eye: No discharge.        Left eye: No discharge.     Extraocular Movements: Extraocular movements intact.     Conjunctiva/sclera: Conjunctivae normal.     Pupils: Pupils are equal, round, and reactive to light.  Neck:     Trachea: Trachea and phonation normal. No tracheal deviation.  Cardiovascular:     Rate and Rhythm: Normal rate and regular rhythm.     Pulses: Normal pulses.          Radial pulses are 2+ on the right side and 2+ on the left side.  Pulmonary:     Effort: Pulmonary effort is normal. No respiratory distress.     Breath sounds: Normal breath sounds and air entry. No stridor, decreased air movement or transmitted upper airway sounds. No decreased breath sounds or wheezing.     Comments: Spoke full  sentences without difficulty; no cough observed in exam room Abdominal:     General: Abdomen is flat.  Musculoskeletal:        General: No swelling or tenderness. Normal range of motion.     Right hand: No swelling, deformity, lacerations, tenderness or bony tenderness. Normal range of motion. Normal strength. Normal capillary refill.     Left hand: No swelling, deformity, lacerations, tenderness or bony tenderness. Normal range of motion. Normal strength. Normal capillary refill.      Cervical back: Normal range of motion and neck supple. No swelling, edema, deformity, erythema, signs of trauma, lacerations, rigidity, tenderness or crepitus. No pain with movement. Normal range of motion.     Right lower leg: No edema.     Left lower leg: No edema.  Lymphadenopathy:     Head:     Right side of head: No submandibular or preauricular adenopathy.     Left side of head: No submandibular or preauricular adenopathy.     Cervical: No cervical adenopathy.     Right cervical: No superficial cervical adenopathy.    Left cervical: No superficial cervical adenopathy.  Skin:    General: Skin is warm and dry.     Capillary Refill: Capillary refill takes less than 2 seconds.     Coloration: Skin is not ashen, cyanotic, jaundiced, mottled, pale or sallow.     Findings: Rash present. No abrasion, abscess, acne, bruising, burn, ecchymosis, erythema, signs of injury, laceration, lesion, petechiae or wound. Rash is papular. Rash is not crusting, macular, nodular, purpuric, pustular, scaling, urticarial or vesicular.     Nails: There is no clubbing.     Comments: Dorsum bilateral hands a few scattered dry nonerythematous papules nontender to palpation no fluctuance; face/cheeks scattered papules without erythema dry nontender to palpation no fluctuance  Neurological:     General: No focal deficit present.     Mental Status: She is alert and oriented to person, place, and time. Mental status is at baseline.     GCS: GCS eye subscore is 4. GCS verbal subscore is 5. GCS motor subscore is 6.     Cranial Nerves: Cranial nerves 2-12 are intact. No cranial nerve deficit, dysarthria or facial asymmetry.     Sensory: Sensation is intact.     Motor: Motor function is intact. No weakness, tremor, atrophy, abnormal muscle tone or seizure activity.     Coordination: Coordination is intact. Coordination normal.     Gait: Gait is intact. Gait normal.     Comments: In/out of chair without difficulty; gait  sure and steady in clinic; bilateral hand grasp equal 5/5  Psychiatric:        Attention and Perception: Attention and perception normal.        Mood and Affect: Mood and affect normal.        Speech: Speech normal.        Behavior: Behavior normal. Behavior is cooperative.        Thought Content: Thought content normal.        Cognition and Memory: Cognition and memory normal.        Judgment: Judgment normal.          Assessment & Plan:  A-atopic dermatitis bilateral hands and pruritic dermatitis facial    P-Medication as directed. Continue prednisone taper 10mg  for another 7 days facial dermatitis will help with hand flare atopic dermatitis also.  Daily emollient e.g. Vaseline/aquaphor/cocoa butter.   Avoid hot steamy showers.  Wear gloves  if washing dishes in sink.  Discussed for daily handwashing ensure tepid to cool water.  Apply water first then soap/alternate hands getting soap from dispenser.  Ensure thoroughly rinses soap from skin.  Apply emollient prn during the day and especially after bathing to lock in moisture.  Symptomatic therapy suggested. Warm to cool water soaks and/or oatmeal baths. Call or return to clinic as needed if these symptoms worsen or fail to improve as anticipated.  Re-evaluation if new or worsening in 1 week after stopping prednisone. Patient verbalized agreement and understanding of treatment plan and had no further questions at this time.     DDx contact dermatitis/atopic dermatitis  Prednisone 10mg  po daily with breakfast (10mg   x 7days) #21 RF0 dispensed from [facility] to finish out taper today.  patient denied side effects e.g. insomnia/increased or decreased appetite, increased blood pressure/blood sugar/heart rate.  Has not required generic zyrtec 10mg  po BID prn itching.  Noncomedogenic skin moisturizer without retin A/retinol/antiaging cream formula.  Daily emollient e.g. Vaseline/aquaphor/cocoa butter.   Avoid hot steamy showers. If not needing to wear mask  at home to protect self leave skin uncovered.   Ensure thoroughly rinses soap from skin.  Apply emollient prn during the day and especially after bathing to lock in moisture.  Symptomatic therapy suggested. Warm to cool water soaks and/or oatmeal baths. Calagel BID prn itching topical also.  Call or return to clinic as needed if these symptoms worsen or fail to improve as anticipated.  Re-evaluation only if new or worsening after stopping prednisone in 1 week. Patient verbalized agreement and understanding of treatment plan and had no further questions at this time.

## 2021-07-30 ENCOUNTER — Ambulatory Visit: Payer: Self-pay | Admitting: *Deleted

## 2021-07-30 VITALS — BP 113/75 | HR 66 | Ht 64.0 in | Wt 128.0 lb

## 2021-07-30 DIAGNOSIS — Z0000 Encounter for general adult medical examination without abnormal findings: Secondary | ICD-10-CM

## 2021-07-30 NOTE — Progress Notes (Signed)
[employer] insurance premium discount evaluation: Labs Drawn. Replacements ROI form signed. Tobacco Free Attestation form signed.  Forms placed in paper chart.  

## 2021-07-31 ENCOUNTER — Encounter: Payer: Self-pay | Admitting: Registered Nurse

## 2021-07-31 ENCOUNTER — Ambulatory Visit: Payer: Self-pay | Admitting: Registered Nurse

## 2021-07-31 VITALS — BP 107/70 | HR 67 | Temp 98.0°F

## 2021-07-31 DIAGNOSIS — L299 Pruritus, unspecified: Secondary | ICD-10-CM

## 2021-07-31 LAB — CMP12+LP+TP+TSH+6AC+CBC/D/PLT
ALT: 21 IU/L (ref 0–32)
AST: 21 IU/L (ref 0–40)
Albumin/Globulin Ratio: 1.5 (ref 1.2–2.2)
Albumin: 4.3 g/dL (ref 3.8–4.8)
Alkaline Phosphatase: 53 IU/L (ref 44–121)
BUN/Creatinine Ratio: 13 (ref 9–23)
BUN: 9 mg/dL (ref 6–24)
Basophils Absolute: 0.1 10*3/uL (ref 0.0–0.2)
Basos: 0 %
Bilirubin Total: 0.5 mg/dL (ref 0.0–1.2)
Calcium: 9.9 mg/dL (ref 8.7–10.2)
Chloride: 101 mmol/L (ref 96–106)
Chol/HDL Ratio: 3.2 ratio (ref 0.0–4.4)
Cholesterol, Total: 182 mg/dL (ref 100–199)
Creatinine, Ser: 0.71 mg/dL (ref 0.57–1.00)
EOS (ABSOLUTE): 0.2 10*3/uL (ref 0.0–0.4)
Eos: 1 %
Estimated CHD Risk: <0.5 times avg. (ref 0.0–1.0)
Free Thyroxine Index: 2.5 (ref 1.2–4.9)
GGT: 29 IU/L (ref 0–60)
Globulin, Total: 2.9 g/dL (ref 1.5–4.5)
Glucose: 73 mg/dL (ref 70–99)
HDL: 57 mg/dL (ref >39)
Hematocrit: 40.4 % (ref 34.0–46.6)
Hemoglobin: 13 g/dL (ref 11.1–15.9)
Immature Grans (Abs): 0 10*3/uL (ref 0.0–0.1)
Immature Granulocytes: 0 %
Iron: 148 ug/dL (ref 27–159)
LDH: 180 IU/L (ref 119–226)
LDL Chol Calc (NIH): 99 mg/dL (ref 0–99)
Lymphocytes Absolute: 3.8 10*3/uL — ABNORMAL HIGH (ref 0.7–3.1)
Lymphs: 28 %
MCH: 27.5 pg (ref 26.6–33.0)
MCHC: 32.2 g/dL (ref 31.5–35.7)
MCV: 85 fL (ref 79–97)
Monocytes Absolute: 0.9 10*3/uL (ref 0.1–0.9)
Monocytes: 7 %
Neutrophils Absolute: 8.6 10*3/uL — ABNORMAL HIGH (ref 1.4–7.0)
Neutrophils: 64 %
Phosphorus: 4 mg/dL (ref 3.0–4.3)
Platelets: 288 10*3/uL (ref 150–450)
Potassium: 3.5 mmol/L (ref 3.5–5.2)
RBC: 4.73 x10E6/uL (ref 3.77–5.28)
RDW: 13.1 % (ref 11.7–15.4)
Sodium: 141 mmol/L (ref 134–144)
T3 Uptake Ratio: 33 % (ref 24–39)
T4, Total: 7.6 ug/dL (ref 4.5–12.0)
TSH: 1.65 u[IU]/mL (ref 0.450–4.500)
Total Protein: 7.2 g/dL (ref 6.0–8.5)
Triglycerides: 148 mg/dL (ref 0–149)
Uric Acid: 5.6 mg/dL (ref 2.6–6.2)
VLDL Cholesterol Cal: 26 mg/dL (ref 5–40)
WBC: 13.5 10*3/uL — ABNORMAL HIGH (ref 3.4–10.8)
eGFR: 109 mL/min/{1.73_m2} (ref >59)

## 2021-07-31 LAB — VITAMIN D 25 HYDROXY (VIT D DEFICIENCY, FRACTURES): Vit D, 25-Hydroxy: 75.3 ng/mL (ref 30.0–100.0)

## 2021-07-31 LAB — HGB A1C W/O EAG: Hgb A1c MFr Bld: 5.8 % — ABNORMAL HIGH (ref 4.8–5.6)

## 2021-07-31 MED ORDER — DIPHENHYDRAMINE HCL 2 % EX GEL: 1.0000 "application " | Freq: Two times a day (BID) | CUTANEOUS | Status: AC | PRN

## 2021-07-31 MED ORDER — CETIRIZINE HCL 10 MG PO TABS: 10.0000 mg | ORAL_TABLET | Freq: Two times a day (BID) | ORAL | 1 refills | Status: DC | PRN

## 2021-07-31 MED ORDER — AQUAPHOR EX OINT: TOPICAL_OINTMENT | CUTANEOUS | 0 refills | Status: DC | PRN

## 2021-07-31 MED ORDER — PREDNISONE 10 MG PO TABS: ORAL_TABLET | ORAL | 0 refills | Status: DC

## 2021-07-31 NOTE — Patient Instructions (Signed)
Pruritus Pruritus is an itchy feeling on the skin. One of the most common causes is dry skin, but many different things can cause itching. Most cases of itching do not require medical attention. Sometimes itchy skin can turn into a rash. Follow these instructions at home: Skin care  Apply moisturizing lotion to your skin as needed. Lotion that contains petroleum jelly is best. Take medicines or apply medicated creams only as told by your health care provider. This may include: Corticosteroid cream. Anti-itch lotions. Oral antihistamines. Apply a cool, wet cloth (cool compress) to the affected areas. Take baths with one of the following: Epsom salts. You can get these at your local pharmacy or grocery store. Follow the instructions on the packaging. Baking soda. Pour a small amount into the bath as told by your health care provider. Colloidal oatmeal. You can get this at your local pharmacy or grocery store. Follow the instructions on the packaging. Apply baking soda paste to your skin. To make the paste, stir water into a small amount of baking soda until it reaches a paste-like consistency. Do not scratch your skin. Do not take hot showers or baths, which can make itching worse. A cool shower may help with itching as long as you apply moisturizing lotion after the shower. Do not use scented soaps, detergents, perfumes, and cosmetic products. Instead, use gentle, unscented versions of these items. General instructions Avoid wearing tight clothes. Keep a journal to help find out what is causing your itching. Write down: What you eat and drink. What cosmetic products you use. What soaps or detergents you use. What you wear, including jewelry. Use a humidifier. This keeps the air moist, which helps to prevent dry skin. Be aware of any changes in your itchiness. Contact a health care provider if: The itching does not go away after several days. You are unusually thirsty or urinating more  than normal. Your skin tingles or feels numb. Your skin or the white parts of your eyes turn yellow (jaundice). You feel weak. You have any of the following: Night sweats. Tiredness (fatigue). Weight loss. Abdominal pain. Summary Pruritus is an itchy feeling on the skin. One of the most common causes is dry skin, but many different conditions and factors can cause itching. Apply moisturizing lotion to your skin as needed. Lotion that contains petroleum jelly is best. Take medicines or apply medicated creams only as told by your health care provider. Do not take hot showers or baths. Do not use scented soaps, detergents, perfumes, or cosmetic products. This information is not intended to replace advice given to you by your health care provider. Make sure you discuss any questions you have with your health care provider. Document Revised: 05/27/2017 Document Reviewed: 05/27/2017 Elsevier Patient Education  2022 [place] Dermatitis Dermatitis is redness, soreness, and swelling (inflammation) of the skin. Contact dermatitis is a reaction to certain substances that touch the skin. Many different substances can cause contact dermatitis. There are two types of contact dermatitis: Irritant contact dermatitis. This type is caused by something that irritates your skin, such as having dry hands from washing them too often with soap. This type does not require previous exposure to the substance for a reaction to occur. This is the most common type. Allergic contact dermatitis. This type is caused by a substance that you are allergic to, such as poison ivy. This type occurs when you have been exposed to the substance (allergen) and develop a sensitivity to it. Dermatitis may develop  soon after your first exposure to the allergen, or it may not develop until the next time you are exposed and every time thereafter. What are the causes? Irritant contact dermatitis is most commonly caused by  exposure to: Makeup. Soaps. Detergents. Bleaches. Acids. Metal salts, such as nickel. Allergic contact dermatitis is most commonly caused by exposure to: Poisonous plants. Chemicals. Jewelry. Latex. Medicines. Preservatives in products, such as clothing. What increases the risk? You are more likely to develop this condition if you have: A job that exposes you to irritants or allergens. Certain medical conditions, such as asthma or eczema. What are the signs or symptoms? Symptoms of this condition may occur on your body anywhere the irritant has touched you or is touched by you. Symptoms include: Dryness or flaking. Redness. Cracks. Itching. Pain or a burning feeling. Blisters. Drainage of small amounts of blood or clear fluid from skin cracks. With allergic contact dermatitis, there may also be swelling in areas such as the eyelids, mouth, or genitals. How is this diagnosed? This condition is diagnosed with a medical history and physical exam. A patch skin test may be performed to help determine the cause. If the condition is related to your job, you may need to see an occupational medicine specialist. How is this treated? This condition is treated by checking for the cause of the reaction and protecting your skin from further contact. Treatment may also include: Steroid creams or ointments. Oral steroid medicines may be needed in more severe cases. Antibiotic medicines or antibacterial ointments, if a skin infection is present. Antihistamine lotion or an antihistamine taken by mouth to ease itching. A bandage (dressing). Follow these instructions at home: Skin care Moisturize your skin as needed. Apply cool compresses to the affected areas. Try applying baking soda paste to your skin. Stir water into baking soda until it reaches a paste-like consistency. Do not scratch your skin, and avoid friction to the affected area. Avoid the use of soaps, perfumes, and  dyes. Medicines Take or apply over-the-counter and prescription medicines only as told by your health care provider. If you were prescribed an antibiotic medicine, take or apply the antibiotic as told by your health care provider. Do not stop using the antibiotic even if your condition improves. Bathing Try taking a bath with: Epsom salts. Follow the instructions on the packaging. You can get these at your local pharmacy or grocery store. Baking soda. Pour a small amount into the bath as directed by your health care provider. Colloidal oatmeal. Follow the instructions on the packaging. You can get this at your local pharmacy or grocery store. Bathe less frequently, such as every other day. Bathe in lukewarm water. Avoid using hot water. Bandage care If you were given a bandage (dressing), change it as told by your health care provider. Wash your hands with soap and water before and after you change your dressing. If soap and water are not available, use hand sanitizer. General instructions Avoid the substance that caused your reaction. If you do not know what caused it, keep a journal to try to track what caused it. Write down: What you eat. What cosmetic products you use. What you drink. What you wear in the affected area. This includes jewelry. Check the affected areas every day for signs of infection. Check for: More redness, swelling, or pain. More fluid or blood. Warmth. Pus or a bad smell. Keep all follow-up visits as told by your health care provider. This is important. Contact a health  care provider if: Your condition does not improve with treatment. Your condition gets worse. You have signs of infection such as swelling, tenderness, redness, soreness, or warmth in the affected area. You have a fever. You have new symptoms. Get help right away if: You have a severe headache, neck pain, or neck stiffness. You vomit. You feel very sleepy. You notice red streaks coming from  the affected area. Your bone or joint underneath the affected area becomes painful after the skin has healed. The affected area turns darker. You have difficulty breathing. Summary Dermatitis is redness, soreness, and swelling (inflammation) of the skin. Contact dermatitis is a reaction to certain substances that touch the skin. Symptoms of this condition may occur on your body anywhere the irritant has touched you or is touched by you. This condition is treated by figuring out what caused the reaction and protecting your skin from further contact. Treatment may also include medicines and skin care. Avoid the substance that caused your reaction. If you do not know what caused it, keep a journal to try to track what caused it. Contact a health care provider if your condition gets worse or you have signs of infection such as swelling, tenderness, redness, soreness, or warmth in the affected area. This information is not intended to replace advice given to you by your health care provider. Make sure you discuss any questions you have with your health care provider. Document Revised: 09/02/2018 Document Reviewed: 11/26/2017 Elsevier Patient Education  2022 [place]. Atopic Dermatitis Atopic dermatitis is a skin disorder that causes inflammation of the skin. It is marked by a red rash and itchy, dry, scaly skin. It is the most common type of eczema. Eczema is a group of skin conditions that cause the skin to become rough and swollen. This condition is generally worse during the cooler winter months and often improves during the warm summer months. Atopic dermatitis usually starts showing signs in infancy and can last through adulthood. This condition cannot be passed from one person to another (is not contagious). Atopic dermatitis may not always be present, but when it is, it is called a flare-up. What are the causes? The exact cause of this condition is not known. Flare-ups may be triggered  by: Coming in contact with something that you are sensitive or allergic to (allergen). Stress. Certain foods. Extremely hot or cold weather. Harsh chemicals and soaps. Dry air. Chlorine. What increases the risk? This condition is more likely to develop in people who have a personal or family history of: Eczema. Allergies. Asthma. Hay fever. What are the signs or symptoms? Symptoms of this condition include: Dry, scaly skin. Red, itchy rash. Itchiness, which can be severe. This may occur before the skin rash. This can make sleeping difficult. Skin thickening and cracking that can occur over time. How is this diagnosed? This condition is diagnosed based on: Your symptoms. Your medical history. A physical exam. How is this treated? There is no cure for this condition, but symptoms can usually be controlled. Treatment focuses on: Controlling the itchiness and scratching. You may be given medicines, such as antihistamines or steroid creams. Limiting exposure to allergens. Recognizing situations that cause stress and developing a plan to manage stress. If your atopic dermatitis does not get better with medicines, or if it is all over your body (widespread), a treatment using a specific type of light (phototherapy) may be used. Follow these instructions at home: Skin care  Keep your skin well moisturized. Doing this  seals in moisture and helps to prevent dryness. Use unscented lotions that have petroleum in them. Avoid lotions that contain alcohol or water. They can dry the skin. Keep baths or showers short (less than 5 minutes) in warm water. Do not use hot water. Use mild, unscented cleansers for bathing. Avoid soap and bubble bath. Apply a moisturizer to your skin right after a bath or shower. Do not apply anything to your skin without checking with your health care provider. General instructions Take or apply over-the-counter and prescription medicines only as told by your  health care provider. Dress in clothes made of cotton or cotton blends. Dress lightly because heat increases itchiness. When washing your clothes, rinse your clothes twice so all of the soap is removed. Avoid any triggers that can cause a flare-up. Keep your fingernails cut short. Avoid scratching. Scratching makes the rash and itchiness worse. A break in the skin from scratching could result in a skin infection (impetigo). Do not be around people who have cold sores or fever blisters. If you get the infection, it may cause your atopic dermatitis to worsen. Keep all follow-up visits. This is important. Contact a health care provider if: Your itchiness interferes with sleep. Your rash gets worse or is not better within one week of starting treatment. You have a fever. You have a rash flare-up after having contact with someone who has cold sores or fever blisters. Get help right away if: You develop pus or soft yellow scabs in the rash area. Summary Atopic dermatitis causes a red rash and itchy, dry, scaly skin. Treatment focuses on controlling the itchiness and scratching, limiting exposure to things that you are sensitive or allergic to (allergens), recognizing situations that cause stress, and developing a plan to manage stress. Keep your skin well moisturized. Keep baths or showers shorter than 5 minutes and use warm water. Do not use hot water. This information is not intended to replace advice given to you by your health care provider. Make sure you discuss any questions you have with your health care provider. Document Revised: 02/21/2020 Document Reviewed: 02/21/2020 Elsevier Patient Education  2022 [employer].

## 2021-07-31 NOTE — Progress Notes (Signed)
Results reviewed with. Diet and exercise recommendations discussed re: A1c, Vitamin D, borderline LDL. Discussed reducing Vitamin D supplement to only weekdays or taking half of current dose to lower to goal level of 60. Routine f/u with pcp. Copy of labs provided to pt. Results routed to pcp per pt request. No further questions/concerns.

## 2021-07-31 NOTE — Progress Notes (Signed)
Subjective:    Patient ID: Susan Lawrence, female    DOB: 06/04/1980, 41 y.o.   MRN: [id number]  41y/o established female pt presenting for f/u itchy facial rash. Currently taking 10mg  daily of Prednisone this week. States she was working in yard and outdoors yesterday and noticed that area around eyes was more swollen and now eyes are itchy and rash is worsening on face again.  Put on aquaphor and took zyrtec 10mg  this am.  Would like more calagel.  Denied tongue swelling/trouble breathing/wheezing/n/v/d or visual changes.  Rash mainly eyes and cheeks. Hot steamy shower yesterday prior to rash worsening.     Review of Systems  Constitutional:  Negative for activity change, appetite change, chills, diaphoresis, fatigue and fever.  HENT:  Positive for facial swelling. Negative for congestion, drooling, ear discharge, ear pain, hearing loss, mouth sores, nosebleeds, postnasal drip, rhinorrhea, sinus pressure, sinus pain, sore throat, trouble swallowing and voice change.   Eyes:  Positive for redness and itching. Negative for photophobia, pain, discharge and visual disturbance.  Respiratory:  Negative for cough, choking, chest tightness, shortness of breath, wheezing and stridor.   Cardiovascular:  Negative for chest pain and palpitations.  Gastrointestinal:  Negative for abdominal pain, diarrhea, nausea and vomiting.  Endocrine: Negative for cold intolerance and heat intolerance.  Genitourinary:  Negative for difficulty urinating.  Musculoskeletal:  Negative for arthralgias, back pain, gait problem, joint swelling, myalgias, neck pain and neck stiffness.  Skin:  Positive for color change and rash. Negative for pallor and wound.  Allergic/Immunologic: Positive for environmental allergies. Negative for food allergies.  Neurological:  Negative for dizziness, tremors, seizures, syncope, facial asymmetry, speech difficulty, weakness, light-headedness, numbness and headaches.  Hematological:   Negative for adenopathy. Does not bruise/bleed easily.  Psychiatric/Behavioral:  Negative for agitation, confusion and sleep disturbance.       Objective:   Physical Exam Vitals and nursing note reviewed.  Constitutional:      General: She is awake. She is not in acute distress.    Appearance: Normal appearance. She is well-developed, well-groomed and normal weight. She is not ill-appearing, toxic-appearing or diaphoretic.  HENT:     Head: Normocephalic. Abrasion present. No raccoon eyes, Battle's sign, contusion, right periorbital erythema, left periorbital erythema or laceration.     Jaw: There is normal jaw occlusion. No trismus, tenderness, swelling, pain on movement or malocclusion.     Salivary Glands: Right salivary gland is not diffusely enlarged or tender. Left salivary gland is not diffusely enlarged or tender.     Right Ear: Hearing and external ear normal.     Left Ear: Hearing and external ear normal.     Nose: Nose normal. No congestion or rhinorrhea.     Mouth/Throat:     Lips: Pink. No lesions.     Mouth: Mucous membranes are moist. No lacerations, oral lesions or angioedema.     Dentition: No gum lesions.     Tongue: No lesions. Tongue does not deviate from midline.     Palate: No mass and lesions.     Pharynx: Uvula midline. Pharyngeal swelling and posterior oropharyngeal erythema present. No oropharyngeal exudate or uvula swelling.     Tonsils: No tonsillar exudate.     Comments: Cobblestoning posterior pharynx; bilateral allergic shiners; patient wearing disposable surgical mask over nose/mouth Eyes:     General: Lids are normal. Vision grossly intact. Gaze aligned appropriately. Allergic shiner present. No scleral icterus.       Right eye: No discharge.  Left eye: No discharge.     Extraocular Movements: Extraocular movements intact.     Conjunctiva/sclera:     Right eye: Right conjunctiva is injected.     Left eye: Left conjunctiva is injected.      Pupils: Pupils are equal, round, and reactive to light.     Comments: Bilateral conjunctival injection eyes 1+/4; upper and lower eyelids nonpitting edema 1+/4 upper slightly more swollen than lower; bilateral allergic shiners; fine scale skin and erythema  Neck:     Trachea: Trachea and phonation normal. No tracheal deviation.  Cardiovascular:     Rate and Rhythm: Normal rate and regular rhythm.     Pulses: Normal pulses.          Radial pulses are 2+ on the right side and 2+ on the left side.     Heart sounds: Normal heart sounds.  Pulmonary:     Effort: Pulmonary effort is normal. No respiratory distress.     Breath sounds: Normal breath sounds and air entry. No stridor, decreased air movement or transmitted upper airway sounds. No decreased breath sounds or wheezing.     Comments: Spoke full sentences without difficulty; no cough observed in exam room Abdominal:     General: Abdomen is flat.  Musculoskeletal:        General: Normal range of motion.     Cervical back: Normal range of motion and neck supple. No swelling, edema, deformity, erythema, signs of trauma, lacerations, rigidity, spasms or crepitus. No pain with movement. Normal range of motion.     Thoracic back: No swelling, edema, deformity, signs of trauma or lacerations. Normal range of motion.     Right lower leg: No edema.     Left lower leg: No edema.  Lymphadenopathy:     Head:     Right side of head: No submandibular or preauricular adenopathy.     Left side of head: No submandibular or preauricular adenopathy.     Cervical:     Right cervical: No superficial cervical adenopathy.    Left cervical: No superficial cervical adenopathy.  Skin:    General: Skin is warm and dry.     Capillary Refill: Capillary refill takes less than 2 seconds.     Coloration: Skin is not ashen, cyanotic, jaundiced, mottled, pale or sallow.     Findings: Erythema and rash present. No abrasion, abscess, acne, bruising, burn, ecchymosis,  signs of injury, laceration, lesion, petechiae or wound. Rash is macular, papular and scaling. Rash is not crusting, nodular, purpuric, pustular, urticarial or vesicular.     Nails: There is no clubbing.     Comments: Bilateral lower eyelids with nonpitting edema 1+/4; scaling/erythema; macular erythema upper eyelids nonpitting edema 1+/4; bilateral cheeks with scattered papules upper and macular erythema  Neurological:     General: No focal deficit present.     Mental Status: She is alert and oriented to person, place, and time. Mental status is at baseline.     GCS: GCS eye subscore is 4. GCS verbal subscore is 5. GCS motor subscore is 6.     Cranial Nerves: Cranial nerves 2-12 are intact. No cranial nerve deficit, dysarthria or facial asymmetry.     Sensory: Sensation is intact.     Motor: Motor function is intact. No weakness, tremor, atrophy, abnormal muscle tone or seizure activity.     Coordination: Coordination is intact. Coordination normal.     Gait: Gait is intact. Gait normal.     Comments:  In/out of chair without difficulty; gait sure and steady in clinic; bilateral hand grasp equal 5/5  Psychiatric:        Attention and Perception: Attention and perception normal.        Mood and Affect: Mood and affect normal.        Speech: Speech normal.        Behavior: Behavior normal. Behavior is cooperative.        Thought Content: Thought content normal.        Cognition and Memory: Cognition and memory normal.        Judgment: Judgment normal.          Assessment & Plan:  A-pruritic dermatitis facial and allergic conjunctivitis   P-? Plant contact dermatitis versus atopic dermatitis/ allergy flare.  Increase zyrtec to 10mg  po BID and restart prednisone taper 10mg  sig T2 po daily x 7 days then decrease to 10mg  po daily x 7 days #21 RF0.  Patient has leftover prednisone at home from previous taper.  Did yard work this weekend and took [employer].  Discussed avoid steam showers  this week.  If no improvement with taking 20mg  today and zyrtec 10mg  this evening increase prednisone to 30mg  tomorrow am.    Daily emollient e.g. Vaseline/aquaphor/cocoa butter.  Given 4 UD aquaphor from clinic stock.   Avoid hot steamy showers.  Wear gloves if washing dishes in sink.  Discussed for daily handwashing ensure tepid to cool water.  Apply water first then soap/alternate hands getting soap from dispenser.  Ensure thoroughly rinses soap from skin.  Apply emollient prn during the day and especially after bathing to lock in moisture.  Symptomatic therapy suggested. Warm to cool water soaks and/or oatmeal baths. Call or return to clinic as needed if these symptoms worsen or fail to improve as anticipated.  Re-evaluation in 1 week.   Avoid scratching. Given 4 UD calagel and aquaphor from clinic stock.  May continue calagel BID prn itching topical also.  Call or return to clinic as needed if these symptoms worsen or fail to improve as anticipated.  Re-evaluation in 1 week.   Patient previously given [employer] handouts on pruritis/contact dermatitis at earlier office visits. Patient verbalized agreement and understanding of treatment plan and had no further questions at this time.   Zyrtec 10mg  po BID prn itching.  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 Patient to apply warm or cool packs prn right eye 5 minutes TID prn or warm compress.  Refresh drops 2 drops both eyes  TID prn x 7 days Discussed blinking irritates eye blood vessels further as eyelid vessels rub on bulbar vessals. Instructed patient to not rub eyes. May need to wash pillowcases more frequently until inflammation resolves especially if discharge noted on pillow. May use over the counter eye drops/tears such as visine or ketotifen per manufacturer instructions for pain/symptom relief. Return to clinic if headache, fever greater than 100.35F, nausea/vomiting, purulent discharge/matting unable to open eye without using fingers after 24 hours of  medication use, foreign body sensation, ciliary flush, worsening photophobia or vision. Discussed to see optometrist same day if visual field loss, worsening light sensitivity, orbital swelling.  Call or return to clinic as needed if these symptoms worsen or fail to improve as anticipated.  Patient verbalized agreement and understanding of treatment plan and had no further questions at this time.  P2: Hand washing, avoid contact use-wear glasses.

## 2021-08-14 ENCOUNTER — Ambulatory Visit: Payer: Self-pay | Admitting: Registered Nurse

## 2021-08-14 ENCOUNTER — Encounter: Payer: Self-pay | Admitting: Registered Nurse

## 2021-08-14 VITALS — BP 114/74 | HR 89 | Temp 99.3°F

## 2021-08-14 DIAGNOSIS — L299 Pruritus, unspecified: Secondary | ICD-10-CM

## 2021-08-14 MED ORDER — FAMOTIDINE 10 MG PO TABS: 10.0000 mg | ORAL_TABLET | Freq: Two times a day (BID) | ORAL | 0 refills | Status: DC

## 2021-08-14 MED ORDER — HYDROXYZINE PAMOATE 25 MG PO CAPS: 25.0000 mg | ORAL_CAPSULE | Freq: Three times a day (TID) | ORAL | 0 refills | Status: DC | PRN

## 2021-08-14 MED ORDER — PREDNISONE 10 MG PO TABS: ORAL_TABLET | ORAL | 0 refills | Status: AC

## 2021-08-14 NOTE — Patient Instructions (Signed)
Allergies, Adult ?An allergy is a condition in which the body's defense system (immune system) comes in contact with an allergen and reacts to it. An allergen is anything that causes an allergic reaction. Allergens cause the immune system to make proteins for fighting infections (antibodies). These antibodies cause cells to release chemicals called histamines that set off the symptoms of an allergic reaction. ?Allergies often affect the nasal passages (allergic rhinitis), eyes (allergic conjunctivitis), skin (atopic dermatitis), and stomach. Allergies can be mild, moderate, or severe. They cannot spread from person to person. Allergies can develop at any age and may be outgrown. ?What are the causes? ?This condition is caused by allergens. Common allergens include: ?Outdoor allergens, such as pollen, car fumes, and mold. ?Indoor allergens, such as dust, smoke, mold, and pet dander. ?Other allergens, such as foods, medicines, scents, insect bites or stings, and other skin irritants. ?What increases the risk? ?You are more likely to develop this condition if you have: ?Family members with allergies. ?Family members who have any condition that may be caused by allergens, such as asthma. This may make you more likely to have other allergies. ?What are the signs or symptoms? ?Symptoms of this condition depend on the severity of the allergy. ?Mild to moderate symptoms ?Runny nose, stuffy nose (nasal congestion), or sneezing. ?Itchy mouth, ears, or throat. ?A feeling of mucus dripping down the back of your throat (postnasal drip). ?Sore throat. ?Itchy, red, watery, or puffy eyes. ?Skin rash, or itchy, red, swollen areas of skin (hives). ?Stomach cramps or bloating. ?Severe symptoms ?Severe allergies to food, medicine, or insect bites may cause anaphylaxis, which can be life-threatening. Symptoms include: ?A red (flushed) face. ?Wheezing or coughing. ?Swollen lips, tongue, or mouth. ?Tight or swollen throat. ?Chest pain or  tightness, or rapid heartbeat. ?Trouble breathing or shortness of breath. ?Pain in the abdomen, vomiting, or diarrhea. ?Dizziness or fainting. ?How is this diagnosed? ?This condition is diagnosed based on your symptoms, your family and medical history, and a physical exam. You may also have tests, including: ?Skin tests to see how your skin reacts to allergens that may be causing your symptoms. Tests include: ?Skin prick test. For this test, an allergen is introduced to your body through a small opening in the skin. ?Intradermal skin test. For this test, a small amount of allergen is injected under the first layer of your skin. ?Patch test. For this test, a small amount of allergen is placed on your skin. The area is covered and then checked after a few days. ?Blood tests. ?A challenge test. For this test, you will eat or breathe in a small amount of allergen to see if you have an allergic reaction. ?You may also be asked to: ?Keep a food diary. This is a record of all the foods, drinks, and symptoms you have in a day. ?Try an elimination diet. To do this: ?Remove certain foods from your diet. ?Add those foods back one by one to find out if any foods cause an allergic reaction. ?How is this treated? ?  ?Treatment for allergies depends on your symptoms. Treatment may include: ?Cold, wet cloths (cold compresses) to soothe itching and swelling. ?Eye drops or nasal sprays. ?Nasal irrigation to help clear your mucus or keep the nasal passages moist. ?A humidifier to add moisture to the air. ?Skin creams to treat rashes or itching. ?Oral antihistamines or other medicines to block the reaction or to treat inflammation. ?Diet changes to remove foods that cause allergies. ?Being exposed again   and again to tiny amounts of allergens to help you build a defense against it (tolerance). This is called immunotherapy. Examples include: ?Allergy shot. You receive an injection that contains an allergen. ?Sublingual immunotherapy. You  take a small dose of allergen under your tongue. ?Emergency injection for anaphylaxis. You give yourself a shot using a syringe (auto-injector) that contains the amount of medicine you need. Your health care provider will teach you how to give yourself an injection. ?Follow these instructions at home: ?Medicines ? ?Take or apply over-the-counter and prescription medicines only as told by your health care provider. ?Always carry your auto-injector pen if you are at risk of anaphylaxis. Give yourself an injection as told by your health care provider. ?Eating and drinking ?Follow instructions from your health care provider about eating or drinking restrictions. ?Drink enough fluid to keep your urine pale yellow. ?General instructions ?Wear a medical alert bracelet or necklace to let others know that you have had anaphylaxis before. ?Avoid known allergens whenever possible. ?Keep all follow-up visits as told by your health care provider. This is important. ?Contact a health care provider if: ?Your symptoms do not get better with treatment. ?Get help right away if: ?You have symptoms of anaphylaxis. These include: ?Swollen mouth, tongue, or throat. ?Pain or tightness in your chest. ?Trouble breathing or shortness of breath. ?Dizziness or fainting. ?Severe abdominal pain, vomiting, or diarrhea. ?These symptoms may represent a serious problem that is an emergency. Do not wait to see if the symptoms will go away. Get medical help right away. Call your local emergency services (911 in the [place] Do not drive yourself to the hospital. ?Summary ?Take or apply over-the-counter and prescription medicines only as told by your health care provider. ?Avoid known allergens when possible. ?Always carry your auto-injector pen if you are at risk of anaphylaxis. Give yourself an injection as told by your health care provider. ?Wear a medical alert bracelet or necklace to let others know that you have had anaphylaxis before. ?Anaphylaxis  is a life-threatening emergency. Get help right away. ?This information is not intended to replace advice given to you by your health care provider. Make sure you discuss any questions you have with your health care provider. ?Document Revised: 01/10/2020 Document Reviewed: 03/24/2019 ?[employer] Patient Education ? 2022 [employer] ? ?

## 2021-08-14 NOTE — Progress Notes (Signed)
? ?Subjective:  ? ? Patient ID: Susan Lawrence, female    DOB: [birth year], 41 y.o.   MRN: [id number] ? ?40y/o established Filipino female pt presenting for f/u of facial rash initially started 07/05/21 and responded to oral prednisone. Reports rash is worsening around eyes. Skin is dry, flaky. States eyes have been matted shut in the mornings requiring her to wash her face or splash water to loosen. Continuing 20mg  prednisone daily and BID Zyrtec and topical calagel.  Has been applying aquaphor daily and not taking steamy showers.  Stopped wearing facial mask at work because worsens facial rash.  Eyes do not feel dry or gritty.  Wearing glasses.  Denied fever/chills/sore throat/tongue swelling/shortness of breath/wheezing/n/v/d/worsening nasal congestion/rhinitis.  Patient would like referral to allergist network in [place] also. ? ? ? ? ?Review of Systems  ?Constitutional:  Negative for activity change, appetite change, chills, diaphoresis, fatigue and fever.  ?HENT:  Positive for facial swelling. Negative for congestion, ear discharge, ear pain, hearing loss, mouth sores, nosebleeds, postnasal drip, rhinorrhea, sinus pressure, sinus pain, sneezing, sore throat, tinnitus, trouble swallowing and voice change.   ?Eyes:  Positive for discharge and itching. Negative for photophobia, pain, redness and visual disturbance.  ?Respiratory:  Negative for cough, choking, chest tightness, shortness of breath, wheezing and stridor.   ?Cardiovascular:  Negative for chest pain, palpitations and leg swelling.  ?Gastrointestinal:  Negative for diarrhea, nausea and vomiting.  ?Endocrine: Negative for cold intolerance and heat intolerance.  ?Genitourinary:  Negative for difficulty urinating.  ?Musculoskeletal:  Negative for arthralgias, back pain, gait problem, joint swelling, myalgias, neck pain and neck stiffness.  ?Skin:  Positive for color change and rash. Negative for pallor and wound.  ?Allergic/Immunologic: Positive for  environmental allergies. Negative for food allergies.  ?Neurological:  Negative for dizziness, tremors, seizures, syncope, facial asymmetry, speech difficulty, weakness, light-headedness, numbness and headaches.  ?Hematological:  Negative for adenopathy. Does not bruise/bleed easily.  ?Psychiatric/Behavioral:  Negative for agitation, confusion and sleep disturbance.   ? ?   ?Objective:  ? Physical Exam ?Vitals and nursing note reviewed.  ?Constitutional:   ?   General: She is awake. She is not in acute distress. ?   Appearance: Normal appearance. She is well-developed, well-groomed and normal weight. She is not ill-appearing, toxic-appearing or diaphoretic.  ?HENT:  ?   Head: Normocephalic and atraumatic.  ?   Jaw: There is normal jaw occlusion.  ?   Salivary Glands: Right salivary gland is not diffusely enlarged. Left salivary gland is not diffusely enlarged.  ? ?   Right Ear: Hearing and external ear normal.  ?   Left Ear: Hearing and external ear normal.  ?   Nose: Nose normal. No congestion or rhinorrhea.  ?   Mouth/Throat:  ?   Lips: Pink. No lesions.  ?   Mouth: Mucous membranes are moist. No lacerations, oral lesions or angioedema.  ?   Dentition: No dental abscesses or gum lesions.  ?   Tongue: No lesions. Tongue does not deviate from midline.  ?   Palate: No mass and lesions.  ?   Pharynx: Oropharynx is clear. Uvula midline. No pharyngeal swelling, oropharyngeal exudate, posterior oropharyngeal erythema or uvula swelling.  ?   Tonsils: No tonsillar exudate.  ?Eyes:  ?   General: Vision grossly intact. Gaze aligned appropriately. Allergic shiner present. No scleral icterus.    ?   Right eye: No discharge.     ?   Left eye: No discharge.  ?  Extraocular Movements: Extraocular movements intact.  ?   Right eye: Normal extraocular motion and no nystagmus.  ?   Left eye: Normal extraocular motion and no nystagmus.  ?   Conjunctiva/sclera: Conjunctivae normal.  ?   Right eye: Right conjunctiva is not injected. No  chemosis, exudate or hemorrhage. ?   Left eye: Left conjunctiva is not injected. No chemosis, exudate or hemorrhage. ?   Pupils: Pupils are equal, round, and reactive to light.  ?   Comments: Upper and lower eyelids nonpitting edema 1-2+/4; fine scaling of skin macular erythema dry no increased temperature ? Calagel dried remnants vs flaking skin; no debris noted eyebrows; some debris eyelashes; conjunctiva unchanged from previous evaluations/office visits  ?Neck:  ?   Trachea: Trachea normal.  ?Cardiovascular:  ?   Rate and Rhythm: Normal rate and regular rhythm.  ?   Pulses: Normal pulses.     ?     Radial pulses are 2+ on the right side and 2+ on the left side.  ?Pulmonary:  ?   Effort: Pulmonary effort is normal. No respiratory distress.  ?   Breath sounds: Normal breath sounds and air entry. No stridor or transmitted upper airway sounds. No wheezing, rhonchi or rales.  ?   Comments: Spoke full sentences without difficulty; no cough observed in exam room ?Abdominal:  ?   General: Abdomen is flat.  ?   Palpations: Abdomen is soft.  ?Musculoskeletal:     ?   General: Normal range of motion.  ?   Cervical back: Normal range of motion and neck supple. No swelling, edema, deformity, erythema, signs of trauma, lacerations, rigidity, tenderness or crepitus. No pain with movement. Normal range of motion.  ?Lymphadenopathy:  ?   Head:  ?   Right side of head: No submental, submandibular, tonsillar, preauricular, posterior auricular or occipital adenopathy.  ?   Left side of head: No submental, submandibular, tonsillar, preauricular, posterior auricular or occipital adenopathy.  ?   Cervical: No cervical adenopathy.  ?   Right cervical: No superficial cervical adenopathy. ?   Left cervical: No superficial cervical adenopathy.  ?Skin: ?   General: Skin is warm and dry.  ?   Capillary Refill: Capillary refill takes less than 2 seconds.  ?   Coloration: Skin is not ashen, cyanotic, jaundiced, mottled, pale or sallow.  ?    Findings: Erythema and rash present. No abrasion, abscess, acne, bruising, burn, ecchymosis, signs of injury, laceration, lesion, petechiae or wound. Rash is macular and scaling. Rash is not crusting, nodular, papular, purpuric, pustular or vesicular.  ?   Nails: There is no clubbing.  ? ?    ?   Comments: Macular rash upper and lower eyelids with fine scaling and nonpitting edema 1-2+/4  gross vision intact able to read AVS with her glasses  ?Neurological:  ?   General: No focal deficit present.  ?   Mental Status: She is alert and oriented to person, place, and time. Mental status is at baseline.  ?   GCS: GCS eye subscore is 4. GCS verbal subscore is 5. GCS motor subscore is 6.  ?   Cranial Nerves: Cranial nerves 2-12 are intact. No cranial nerve deficit, dysarthria or facial asymmetry.  ?   Sensory: Sensation is intact. No sensory deficit.  ?   Motor: Motor function is intact. No weakness, tremor, atrophy, abnormal muscle tone or seizure activity.  ?   Coordination: Coordination is intact. Coordination normal.  ?  Gait: Gait is intact. Gait normal.  ?   Comments: In/out of chair and on/off exam table without difficulty; gait sure and steady in clinic; bilateral hand grasp equal 5/5  ?Psychiatric:     ?   Attention and Perception: Attention and perception normal.     ?   Mood and Affect: Mood and affect normal.     ?   Speech: Speech normal.     ?   Behavior: Behavior normal. Behavior is cooperative.     ?   Thought Content: Thought content normal.     ?   Cognition and Memory: Cognition and memory normal.     ?   Judgment: Judgment normal.  ? ? ? ? ? ?   ?Assessment & Plan:  ? ?A-pruritic dermatitis ?  ?P-Failed prednisone 20mg  long taper and zyrtec 10mg  po BID with topical calagel BID prn.  Discussed may be allergies due to worsening pollen counts or something else.  Recommended allergy testing/allergy referral.  Ambulatory referral placed today to [facility] provider for [facility].  RN  contacted office to verify if consult required faxing also.  Patient given contact information and notified appt will most likely be mid-April.   Discussed with patient start famotidine 10mg  po BID #30 RF0 elect

## 2021-08-16 ENCOUNTER — Encounter: Payer: Self-pay | Admitting: *Deleted

## 2021-08-16 ENCOUNTER — Telehealth: Payer: Self-pay | Admitting: Registered Nurse

## 2021-08-16 ENCOUNTER — Encounter: Payer: Self-pay | Admitting: Registered Nurse

## 2021-08-16 DIAGNOSIS — L299 Pruritus, unspecified: Secondary | ICD-10-CM

## 2021-08-16 NOTE — Telephone Encounter (Signed)
Patient not at work today sick child contacted via telephone and patient stated started hydroxyzine also but makes her sleepy so only taking at bedtime.  Facial rash much improved with hydroxyzine, famotidine, prednisone combination.  Denied dyspnea/dysphagia/dysphasia/wheezing.  Discussed with patient will follow up with her next Tuesday again 3/28.  Patient A&Ox3 spoke full sentences without difficulty; no audible cough/congestion/throat clearing during 3 minute telephone call.  Patient verbalized understanding information/instructions, agreed with plan of care and had no further questions at this time.  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 ?

## 2021-08-23 NOTE — Telephone Encounter (Signed)
Patient seen in [facility]center today.  Stated only taking prednisone 10mg  po daily now.  Stopped pepcid/zyrtec/hydroxyzine and rash has not started again/has resolved.  Continuing moisturizer face.  Feeling well denied questions or concerns.  Skin warm dry and pink face.  A few scattered nonerythematous papules noted on cheeks.  No edema bilateral upper or lower eyelids.  No skin scaling/peeling/erythema facial.  Respirations evan and unlabored RA.  A&Ox3 gait sure and steady.  Patient to follow up re-evaluation if rash re-occurs.  I still recommend allergy testing/follow up.  Patient verbalized understanding information/instructions, agreed with plan of care and had no further questions at this time. ?

## 2021-09-20 ENCOUNTER — Encounter: Payer: Self-pay | Admitting: Registered Nurse

## 2021-09-20 ENCOUNTER — Ambulatory Visit: Payer: Self-pay | Admitting: Registered Nurse

## 2021-09-20 VITALS — HR 70 | Resp 16

## 2021-09-20 DIAGNOSIS — R21 Rash and other nonspecific skin eruption: Secondary | ICD-10-CM

## 2021-09-20 DIAGNOSIS — R7303 Prediabetes: Secondary | ICD-10-CM

## 2021-09-20 MED ORDER — PREDNISONE 10 MG PO TABS: ORAL_TABLET | ORAL | 0 refills | Status: AC

## 2021-09-20 NOTE — Progress Notes (Signed)
? ?Subjective:  ? ? Patient ID: Susan Lawrence, female    DOB: 1980-10-11, 41 y.o.   MRN: [id number] ? ?41y/o established female patient here for recurrent facial rash.  Stated was seen by urgent care provider 09/02/21 on the weekend as eyes were matted shut/worsening eyelid rash and started on prednisone 67m x 4 days, mupirocin topical BID and doxycycline 1055mpo BID for blepharitis.  Eyelid rash still present and ran out of prednisone/took last dose this am.  Had some leftover tabs at home and took 3059mhis am from last Rx I dispensed to her from [facility].  Denied fever/chills/headache/dyspnea/wheezing.   Stated skin itchy but eyes are not itchy currently.  Denied red eyes or visual changes.   Using zyrtec 15m24m BID prn itching. Aquaphor ointment to facial skin prn.  Mupirocin ointment prn also from UC provider.   Cucumber slices to eyelids prn.  Stopped calagel as "burning" sensation skin.  Denied new personal hygiene or laundry/cleaning products.  History of illness: 19 Jul 2021 facial rash cheeks thought due to mask wear/contact dermatitis at that time.  Patient also had flare of hand rash/atopic dermatitis at that time. Started on emollient and oral prednisone along with antihistamine for itching prn.  Rash improved until worked in yard flared again and seen in clinic again 07/31/21.  Next flare up 08/14/21 increased prednisone to 40mg52marted of famotidine and atarax and allergy referral [facility] (patient has not scheduled)  3/30 patient eyelid rash resolved and only a few cheek papules nonerythematous remaining and was finishing prednisone taper currently on 15mg 47mnisone at time of reassessment. ? ? ? ? ?Review of Systems  ?Constitutional:  Negative for activity change, appetite change, chills, diaphoresis, fatigue and fever.  ?HENT:  Negative for congestion, facial swelling, sinus pressure, sinus pain, trouble swallowing and voice change.   ?Eyes:  Negative for photophobia, redness, itching and visual  disturbance.  ?Gastrointestinal:  Negative for abdominal pain, diarrhea, nausea and vomiting.  ?Endocrine: Negative for cold intolerance and heat intolerance.  ?Genitourinary:  Negative for difficulty urinating.  ?Musculoskeletal:  Negative for gait problem, neck pain and neck stiffness.  ?Skin:  Positive for color change and rash. Negative for pallor and wound.  ?Allergic/Immunologic: Positive for environmental allergies. Negative for food allergies.  ?Neurological:  Negative for dizziness, tremors, seizures, syncope, facial asymmetry, speech difficulty, weakness, light-headedness, numbness and headaches.  ?Hematological:  Negative for adenopathy. Does not bruise/bleed easily.  ?Psychiatric/Behavioral:  Negative for agitation, confusion and sleep disturbance.   ? ?   ?Objective:  ? Physical Exam ?Vitals and nursing note reviewed.  ?Constitutional:   ?   General: She is awake. She is not in acute distress. ?   Appearance: Normal appearance. She is well-developed, well-groomed and normal weight. She is not ill-appearing, toxic-appearing or diaphoretic.  ?HENT:  ?   Head: Normocephalic and atraumatic. No raccoon eyes, abrasion, right periorbital erythema, left periorbital erythema or laceration.  ?   Jaw: There is normal jaw occlusion.  ?   Salivary Glands: Right salivary gland is not diffusely enlarged or tender. Left salivary gland is not diffusely enlarged or tender.  ?   Right Ear: Hearing and external ear normal.  ?   Left Ear: Hearing and external ear normal.  ?   Nose: Nose normal. No congestion or rhinorrhea.  ?   Mouth/Throat:  ?   Lips: Pink. No lesions.  ?   Mouth: Mucous membranes are moist. No oral lesions or angioedema.  ?  Tongue: No lesions. Tongue does not deviate from midline.  ?   Palate: No mass and lesions.  ?   Pharynx: Oropharynx is clear. Uvula midline.  ?   Tonsils: No tonsillar exudate.  ?Eyes:  ?   General: Lids are normal. Vision grossly intact. Gaze aligned appropriately. No allergic  shiner, visual field deficit or scleral icterus.    ?   Right eye: No discharge.     ?   Left eye: No discharge.  ?   Extraocular Movements: Extraocular movements intact.  ?   Conjunctiva/sclera: Conjunctivae normal.  ?   Pupils: Pupils are equal, round, and reactive to light.  ?Neck:  ?   Trachea: Trachea and phonation normal. No tracheal deviation.  ?Cardiovascular:  ?   Rate and Rhythm: Normal rate and regular rhythm.  ?   Pulses: Normal pulses.     ?     Radial pulses are 2+ on the right side and 2+ on the left side.  ?Pulmonary:  ?   Effort: Pulmonary effort is normal. No respiratory distress.  ?   Breath sounds: Normal breath sounds and air entry. No stridor, decreased air movement or transmitted upper airway sounds. No decreased breath sounds, wheezing, rhonchi or rales.  ?   Comments: Spoke full sentences without difficulty; no cough observed in exam room ?Abdominal:  ?   General: Abdomen is flat.  ?Musculoskeletal:     ?   General: Normal range of motion.  ?   Cervical back: Normal range of motion and neck supple. No swelling, edema, deformity, erythema, signs of trauma, lacerations, rigidity or crepitus. No pain with movement. Normal range of motion.  ?Lymphadenopathy:  ?   Head:  ?   Right side of head: No submandibular or preauricular adenopathy.  ?   Left side of head: No submandibular or preauricular adenopathy.  ?   Cervical:  ?   Right cervical: No superficial cervical adenopathy. ?   Left cervical: No superficial cervical adenopathy.  ?Skin: ?   General: Skin is warm and dry.  ?   Capillary Refill: Capillary refill takes less than 2 seconds.  ?   Coloration: Skin is not ashen, cyanotic, jaundiced, mottled, pale or sallow.  ?   Findings: Erythema and rash present. No abrasion, abscess, acne, bruising, burn, ecchymosis, signs of injury, laceration, lesion, petechiae or wound. Rash is macular, papular and scaling. Rash is not crusting, nodular, purpuric, pustular, urticarial or vesicular.  ?   Nails:  There is no clubbing.  ? ?    ?   Comments: Bilateral cheeks with faint erythema and scattered papules dry; macular erythema/purplish 0-1+/4 mild nonpitting edema fine scaling rash bilateral upper and lower eyelids following outline of her eyeglass frame dry no discharge "itchy"  ?Neurological:  ?   General: No focal deficit present.  ?   Mental Status: She is alert and oriented to person, place, and time. Mental status is at baseline.  ?   GCS: GCS eye subscore is 4. GCS verbal subscore is 5. GCS motor subscore is 6.  ?   Cranial Nerves: Cranial nerves 2-12 are intact. No cranial nerve deficit, dysarthria or facial asymmetry.  ?   Sensory: Sensation is intact.  ?   Motor: Motor function is intact. No weakness, tremor, atrophy, abnormal muscle tone or seizure activity.  ?   Coordination: Coordination is intact. Coordination normal.  ?   Gait: Gait is intact. Gait normal.  ?   Comments: In/out of  chair and on/off exam table without difficulty; gait sure and steady in clinic; bilateral hand grasp equal 5/5  ?Psychiatric:     ?   Attention and Perception: Attention and perception normal.     ?   Mood and Affect: Mood and affect normal.     ?   Speech: Speech normal.     ?   Behavior: Behavior normal. Behavior is cooperative.     ?   Thought Content: Thought content normal.     ?   Cognition and Memory: Cognition and memory normal.     ?   Judgment: Judgment normal.  ? ? ? ?Labs completed Mar 2023 for annual Be Well exam  ? Latest Reference Range & Units 07/30/21 09:51  ?Sodium 134 - 144 mmol/L 141  ?Potassium 3.5 - 5.2 mmol/L 3.5  ?Chloride 96 - 106 mmol/L 101  ?Glucose 70 - 99 mg/dL 73  ?BUN 6 - 24 mg/dL 9  ?Creatinine 0.57 - 1.00 mg/dL 0.71  ?Calcium 8.7 - 10.2 mg/dL 9.9  ?BUN/Creatinine Ratio 9 - 23  13  ?eGFR >59 mL/min/1.73 109  ?Phosphorus 3.0 - 4.3 mg/dL 4.0  ?Alkaline Phosphatase 44 - 121 IU/L 53  ?Albumin 3.8 - 4.8 g/dL 4.3  ?Albumin/Globulin Ratio 1.2 - 2.2  1.5  ?Uric Acid 2.6 - 6.2 mg/dL 5.6  ?AST 0 - 40  IU/L 21  ?ALT 0 - 32 IU/L 21  ?Total Protein 6.0 - 8.5 g/dL 7.2  ?Total Bilirubin 0.0 - 1.2 mg/dL 0.5  ?GGT 0 - 60 IU/L 29  ?Estimated CHD Risk 0.0 - 1.0 times avg.  < 0.5  ?LDH 119 - 226 IU/L 180  ?Total CHOL/HD

## 2021-09-20 NOTE — Patient Instructions (Addendum)
Dermatomyositis ?Dermatomyositis is a rare muscle disease that causes muscle weakness and a rash. It usually affects muscles that are closest to the trunk of your body (torso). You may have a combination of muscle weakness, pain, and swelling. This condition can make it hard to rise from a sitting position, climb stairs, lift objects, or reach over your head. ?What are the causes? ?The cause of this condition is not known. ?What increases the risk? ?You are more likely to develop this condition if: ?You are female. ?You have cancer. ?You have lung disease. ?What are the signs or symptoms? ?Symptoms of this condition may include: ?Skin changes, such as: ?A bluish-purple rash that may be itchy. This often develops before the muscle weakness. It appears on the face, neck, shoulders, upper chest, elbows, knees, knuckles, and back. ?Hardened bumps of calcium deposits under the skin (especially in children). ?Muscle weakness, swelling, and pain. ?Swelling of the face and eyelids. ?Fatigue. ?Weight loss. ?Low fever. ?Heartburn due to changes in muscles of the esophagus. ?Trouble swallowing, talking, and breathing. ?How is this diagnosed? ?This condition may be diagnosed based on: ?Blood tests. ?A test to check whether your muscles are responding properly to electrical signals from your nerves (electromyogram). ?Removal of a small tissue sample from a weakened muscle to be examined under a microscope (muscle biopsy). ?Removal of a small skin sample from an area with a rash to be examined under a microscope (skin biopsy). ?Chest X-ray. ?MRI. ?How is this treated? ?This condition may be treated with: ?Immunosuppressants such as steroids. These are medicines that weaken and decrease the activity of your body's disease-fighting system (immune system) and reduce inflammation. ?Medicine containing antibodies to help your body fight infections (immunoglobulin). ?Physical therapy and exercise to prevent muscle loss. ?Follow these  instructions at home: ? ?Wear protective clothing and high-protection sunscreen when you go outside. Areas that are affected by a rash are more sensitive to the sun. ?Use skin creams or ointments as told by your health care provider. ?Take over-the-counter and prescription medicines only as told by your health care provider. ?If physical therapy was prescribed, do exercises as told. ?Do not eat right before bedtime. Raise (elevate) the head of your bed to decrease heartburn. ?If told by your health care provider, eat more protein in your diet. ?Where to find more information ?National Organization for Rare Disorders: rarediseases.org ?Contact a health care provider if: ?Your condition gets worse. ?You have unexplained muscle weakness. ?You develop a new rash. ?You have a fever and cough. ?Get help right away if: ?You have difficulty swallowing. ?You develop breathing problems. ?These symptoms may be an emergency. Get help right away. Call 911. ?Do not wait to see if the symptoms will go away. ?Do not drive yourself to the hospital. ?Summary ?Dermatomyositis is a rare muscle disease that causes muscle weakness and a rash. ?You may have a combination of muscle weakness, pain, and swelling. ?Treatment may include medicines and physical therapy. ?Wear protective clothing and high-protection sunscreen when you go outside. Areas that are affected by a rash are more sensitive to the sun. ?This information is not intended to replace advice given to you by your health care provider. Make sure you discuss any questions you have with your health care provider. ?Document Revised: 11/16/2020 Document Reviewed: 11/16/2020 ?[employer]vier Patient Education ? 2023 [employer] ?Systemic Lupus Erythematosus, Adult ?Systemic lupus erythematosus (SLE) is a long-term (chronic) disease that can affect many parts of the body. SLE is an autoimmune disease. With this  type of disease, the body's defense system (immune system) mistakenly attacks  healthy tissues. This can cause damage to the skin, joints, blood vessels, brain, kidneys, lungs, heart, and other internal organs. It causes pain, irritation, and inflammation. ?What are the causes? ?The cause of this condition is not known. ?What increases the risk? ?The following factors may make you more likely to develop this condition: ?Being female. ?Being of Asian, Hispanic, or African American descent. ?Having a family history of the condition. ?Being exposed to tobacco smoke or smoking cigarettes. ?Having an infection with a virus, such as Epstein-Barr virus. ?Having a history of exposure to silica dust, metals, chemicals, mold or mildew, or insecticides. ?Using oral contraceptives or hormone replacement therapy. ?What are the signs or symptoms? ?This condition can affect almost any organ or system in the body. Symptoms of the condition depend on which organ or system is affected. ?The most common symptoms include: ?Fever. ?Tiredness (fatigue). ?Weight loss. ?Muscle aches. ?Joint pain. ?Skin rashes, especially over the nose and cheeks (butterfly rash) and after sun exposure. ?Symptoms can come and go. A period of time when symptoms get worse or come back is called a flare. A period of time with no symptoms is called a remission. ?How is this diagnosed? ?This condition is diagnosed based on: ?Your symptoms. ?Your medical history. ?A physical exam. ?You may also have tests, including: ?Blood tests. ?Urine tests. ?A chest X-ray. ?You may be referred to an autoimmune disease specialist (rheumatologist). ?How is this treated? ?There is no cure for this condition, but treatment can help to control symptoms, prevent flares (keep symptoms in remission), and prevent damage to the heart, lungs, kidneys, and other organs. Treatment will depend on what symptoms you are having and what organs or systems are affected. Treatment may involve taking a combination of medicines over time. ?Common medicines used to treat this  condition include: ?Antimalarial medicines to control symptoms, prevent flares, and protect against organ damage. ?Corticosteroids and NSAIDs to reduce inflammation. ?Medicines to weaken your immune system (immunosuppressants). ?Biologic response modifiers to reduce inflammation and damage. ?Follow these instructions at home: ?Medicines ?Take over-the-counter and prescription medicines only as told by your health care provider. ?Do not take any medicines that contain estrogen without first checking with your health care provider. Estrogen can trigger flares and may increase your risk for blood clots. ?Eating and drinking ?Eat a heart-healthy diet. This may include: ?Eating high-fiber foods, such as beans, whole grains, and fresh fruits and vegetables. ?Eating heart-healthy fats (omega-3 fats), such as fish, flaxseed, and flaxseed oil. ?Limiting foods that are high in saturated fat and cholesterol, such as processed and fried foods, fatty meat, and full-fat dairy. ?Limiting how much salt (sodium) you eat. ?Include calcium and vitamin D in your diet. Good sources of calcium and vitamin D include: ?Low-fat dairy products such as milk, yogurt, and cheese. ?Certain fish, such as fresh or canned salmon, tuna, and sardines. ?Products that have calcium and vitamin D added to them (are fortified), such as fortified cereals or juice. ?Lifestyle ? ?  ? ?Stay active, as directed by your health care provider. ?Do not use any products that contain nicotine or tobacco. These products include cigarettes, chewing tobacco, and vaping devices, such as e-cigarettes. If you need help quitting, ask your health care provider. ?Protect your skin from the sun by applying sunblock and wearing protective hats and clothing. ?Learn as much as you can about your condition and have a good support system in place. Support may  come from family, friends, or a lupus support group. ?General instructions ?Work closely with all of your health care  providers to manage your condition. ?Stay up to date on all vaccines as told by your health care provider. ?Keep all follow-up visits. This is important. ?Contact a health care provider if: ?You have a fev

## 2021-11-13 ENCOUNTER — Ambulatory Visit: Payer: Self-pay | Admitting: Registered Nurse

## 2021-11-13 ENCOUNTER — Encounter: Payer: Self-pay | Admitting: Registered Nurse

## 2021-11-13 VITALS — BP 110/74 | HR 70 | Temp 97.8°F

## 2021-11-13 DIAGNOSIS — L299 Pruritus, unspecified: Secondary | ICD-10-CM

## 2021-11-13 DIAGNOSIS — H1033 Unspecified acute conjunctivitis, bilateral: Secondary | ICD-10-CM

## 2021-11-13 DIAGNOSIS — R21 Rash and other nonspecific skin eruption: Secondary | ICD-10-CM

## 2021-11-13 MED ORDER — CLOBETASOL PROPIONATE 0.05 % EX CREA: 1.0000 | TOPICAL_CREAM | Freq: Two times a day (BID) | CUTANEOUS | 0 refills | Status: DC

## 2021-11-13 MED ORDER — PREDNISONE 10 MG PO TABS: ORAL_TABLET | ORAL | 0 refills | Status: AC

## 2021-11-13 MED ORDER — CETIRIZINE HCL 10 MG PO TABS: 10.0000 mg | ORAL_TABLET | Freq: Two times a day (BID) | ORAL | 1 refills | Status: AC | PRN

## 2021-11-13 MED ORDER — CARBOXYMETHYLCELLULOSE SOD PF 0.5 % OP SOLN: 1.0000 [drp] | Freq: Three times a day (TID) | OPHTHALMIC | 0 refills | Status: AC | PRN

## 2021-11-13 NOTE — Patient Instructions (Addendum)
Food Choices for Egg Allergy Egg allergy is caused by the protein in egg whites. Although most of the protein is in the egg whites, it is impossible to separate the egg white from the egg yolk completely. Egg allergy is common in children, though most children outgrow it. An egg allergy can be mild or severe. It can cause symptoms that range from mild to serious or even life-threatening. Avoiding products that contain eggs is the best way to avoid symptoms. If you have an egg allergy, talk with your health care provider or a dietitian about what foods you can and cannot eat. What are tips for following this plan? Avoiding eggs and all foods with egg or egg proteins is the best treatment for an egg allergy. However, egg is a good source of protein and other nutrients. Work with your health care provider or a dietitian to make sure that your diet includes enough other sources of these nutrients. Reading food labels In the [place], food companies are required to list egg on the food label of any food that contains egg or egg proteins. Eggs and egg products are included in many foods, so it is important to always read the labels. Egg may be identified on a food label as dried, powdered, solid, white, or yolk. Other names for egg proteins or types of ingredients that contain egg proteins include: Albumin. Lysozyme. Mayonnaise. [place]. Ovalbumin. Surimi. Lecithin. Egg proteins can sometimes cross-contaminate other foods during processing. It is best to avoid any product that has a warning such as "may contain egg" or "processed in a facility that also processes egg." [place] are not required to report this cross-contamination, so you should contact the company if you have any doubt about a food that does not contain a warning. Shopping Eggs and egg products are in many foods. When shopping, make sure you check for egg proteins in: Egg substitutes. Salad dressings. Meatballs and meat  loaf. Marzipan. Marshmallow. Nougat. Baked goods and frostings. Ice cream. Pasta. Soups. Pretzels. Puddings and custards. Specialty coffees or alcoholic drinks topped with foam. The items listed above may not be a complete list of foods and beverages you should avoid. Contact your health care provider or a dietitian for more information. Cooking When cooking: Do not use eggs or egg products. Do not try to separate egg yolk from egg white. It is not possible to remove all the egg protein, and even a small amount can cause a serious allergic reaction. If you are following a recipe that calls for eggs, you can substitute other ingredients instead. For one egg, use any of the following: 1 packet of unflavored gelatin dissolved in 2 tablespoons (30 mL) of warm water. 1 teaspoon (4 g) of yeast dissolved in 1 cup (237 mL) of warm water. 1 teaspoon (2.5 g) of ground flaxseed mixed with 3 tablespoons (44 mL) of warm water. 1 tablespoon (15 mL) of vegetable oil mixed with 1 tablespoon (15 mL) of water and 1 teaspoon (4.6 g) of baking powder.  cup (56 g) mashed banana or  cup (61 g) applesauce mixed with  teaspoon (2.3 g) of baking powder. If you have egg products in your household for other family members, you should keep these foods in a separate area. Always wash utensils and dishes with hot water and dishwashing soap. General information  Talk with your health care provider about a prescription for an epinephrine auto-injector. Epinephrine is a medicine that can reverse or prevent a severe  allergic reaction (anaphylactic reaction). If you are at risk for a severe allergic reaction from eggs, you may need to carry an auto-injector with you at all times. When you go out to eat, always ask your server if your food contains or was prepared with or near any egg product. Egg can cross-contaminate other foods if foods are prepared close together. If your child has an egg allergy, make sure that your  child's caregiver, school, or anyone else who may be feeding your child knows about the allergy. It may be best to send home-prepared food with your child for meals or snacks at school. In the past, vaccines like measles, mumps, rubella (MMR) and the flu shot were made with egg protein. Today, these vaccines are safe if you have an egg allergy. The vaccine for yellow fever is still made with egg protein. This is not a common vaccine in the [place], but you may need it for travel to [place] or [place]. Talk to your health care provider if this is the case for you. If you are allergic to chicken eggs, you should also avoid eggs from ducks, geese, turkeys, and quail. Summary Egg allergy is caused by the protein in egg whites. An egg allergy can be mild or severe. In the [place], food companies are required to list egg on the food label of any food that contains egg or egg proteins. If you have an egg allergy, do not eat eggs or foods that contain egg proteins. Make sure you read food labels when shopping and substitute other ingredients for eggs when cooking. This information is not intended to replace advice given to you by your health care provider. Make sure you discuss any questions you have with your health care provider. Document Revised: 08/09/2020 Document Reviewed: 08/09/2020 Elsevier Patient Education  [place] Allergy A food allergy is an abnormal reaction to a food (food allergen) by the body's defense system (immune system). Foods that commonly cause allergies include: Milk. Seafood. Eggs. Peanuts. Wheat. Soy. Tree nuts such as pecans, walnuts, and cashews. What are the causes? Food allergies happen when the immune system sees a food as harmful and releases proteins (antibodies) to fight it. What are the signs or symptoms? Symptoms may be mild or severe. They usually start minutes after eating the food, but they can occur even a few hours later. In  people with a severe allergy, symptoms can start within seconds. Mild symptoms of this condition include: Congested nose. Tingling in the mouth. An itchy, red rash. Vomiting. Diarrhea. In people with a severe allergy, a life-threatening reaction can occur called anaphylaxis. Get help right away if you have symptoms of anaphylaxis, such as: Feeling warm in the face (flushed). This may include redness. Itchy, red, swollen areas of skin (hives). Swelling of the eyes, lips, face, mouth, tongue, or throat. Difficulty breathing, speaking, or swallowing. Noisy breathing, high-pitched whistling sounds when you breathe, most often when you breathe out (wheezing). Dizziness, light-headedness, or fainting. Pain or cramping in the abdomen. How is this diagnosed? This condition may be diagnosed based on: A physical exam. Your medical history. Skin tests. Blood tests. A food challenge test. This test involves eating the food that may be causing the allergic response while being monitored for a reaction by your health care provider. The results of an elimination diet. The elimination diet involves removing foods from your diet and then adding them back in, one at a time. A food diary. How  is this treated? There is no cure for food allergies. Treatment focuses on preventing exposure to the food or foods you are allergic to and treating reactions if you are exposed to the food. Mild symptoms may not need treatment.  Severe reactions usually need to be treated at a hospital. Treatment may include: Medicines that help: Tighten your blood vessels (epinephrine). Relieve itching and hives (antihistamines). Widen the narrow and tight airways (bronchodilators). Reduce swelling (corticosteroids). Oxygen therapy to help you breathe. IV fluids to keep you hydrated. After a severe reaction, you may be given rescue medicines, such as: An anaphylaxis kit. An epinephrine injection, commonly called an  auto-injector "pen" (pre-filled automatic epinephrine injection device). Your health care provider may teach you how to use these if you are accidentally exposed to an allergen. Follow these instructions at home: If you have a potential allergy: Follow the elimination diet as told by your health care provider. Keep a food diary as told by your health care provider. Every day, write down: What you eat and drink and when. What symptoms you have and when. If you have a severe allergy:  Wear a medical alert bracelet or necklace that describes your allergy. Carry your anaphylaxis kit or an auto-injector pen with you at all times. Use them as told by your health care provider. Make sure that you, your family members, and your employer know: The signs of anaphylaxis. How to use an anaphylaxis kit. How to use an auto-injector pen. If you think that you are having an anaphylactic reaction, use your auto-injector pen or anaphylaxis kit. Replace your epinephrine immediately after you use your auto-injector pen. This is important if you have another reaction. If possible, carry two epinephrine auto-injector pens. Get medical care after using your auto-injector pen. This is important because you can have a delayed, life-threatening reaction after taking the medicine (rebound anaphylaxis). General instructions Avoid the foods that you are allergic to. Read food labels before you eat packaged items. Look for ingredients that you are allergic to. When you are at a restaurant, tell your server that you have an allergy. If you are unsure whether a meal has an ingredient that you are allergic to, ask your server. Take over-the-counter and prescription medicines only as told by your health care provider. Do not drive or operate machinery until your health care provider says that it is safe. Inform all of your health care providers that you have a food allergy. Work with your health care providers to make an  anaphylaxis plan. Preparation is important. If you think that you might be allergic to something else, talk with your health care provider. Do not eat a food to see if you are allergic to it without talking with your health care provider first. Contact a health care provider if: You have symptoms that do not go away within 2 days. You have symptoms that get worse. You have new symptoms. Get help right away if you have symptoms of anaphylaxis: Flushed skin. Hives. Swelling of the eyes, lips, face, mouth, tongue, or throat. Difficulty breathing, speaking, or swallowing. Wheezing. Dizziness, light-headedness or fainting. Pain or cramping in the abdomen. These symptoms may represent a serious problem that is an emergency. Do not wait to see if the symptoms will go away. Use your auto-injector pen or anaphylaxis kit as you have been told. Get medical help right away. Call your local emergency services (911 in the [place] Do not drive yourself to the hospital. If you needed to use  an auto-injector pen, you need more medical care even if the medicine seems to be helping. This is important because anaphylaxis may happen again within 72 hours. Summary A food allergy is an abnormal reaction to a food (food allergen) by the body's defense system (immune system). There is no cure for food allergies. Treatment focuses on preventing exposure to the food or foods you are allergic to and treating reactions if you are exposed to the food. Wear a medical alert bracelet or necklace that describes your allergy. If you have symptoms of anaphylaxis, use your auto-injector pen or anaphylaxis kit as you have been instructed, and get medical help right away. This information is not intended to replace advice given to you by your health care provider. Make sure you discuss any questions you have with your health care provider. Document Revised: 08/29/2020 Document Reviewed: 08/29/2020 Elsevier Patient Education  [place].  Allergic Conjunctivitis, Adult Allergic conjunctivitis is inflammation of the conjunctiva. The conjunctiva is the thin, clear membrane that covers the white part of the eye and the inner surface of the eyelid. In this condition: The blood vessels in the conjunctiva become irritated and swell. The eyes become red or pink and feel itchy. Allergic conjunctivitis cannot be spread from person to person. This condition can develop at any age and may be outgrown. What are the causes? This condition is caused by allergens. These are things that can cause an allergic reaction in some people but not in other people. Common allergens include: Outdoor allergens, such as: Pollen, including pollen from grass and weeds. Mold spores. Car fumes. Indoor allergens, such as: Dust. Smoke. Mold spores. Proteins in a pet's urine, saliva, or dander. What increases the risk? You may be more likely to develop this condition if you have a family history of these things: Allergies. Conditions caused by being exposed to allergens, such as: Allergic rhinitis. This is an allergic reaction that affects the nose. Bronchial asthma. This condition affects the large airways in the lungs and makes breathing difficult. Atopic dermatitis (eczema). This is inflammation of the skin that is long-term (chronic). What are the signs or symptoms? Symptoms of this condition include eyes that are: Itchy. Red. Watery. Puffy. Your eyes may also: Sting or burn. Have clear fluid draining from them. Have thick mucus discharge and pain (vernal conjunctivitis). How is this diagnosed? This condition may be diagnosed by: Your medical history. A physical exam. Tests of the fluid draining from your eyes to rule out other causes. Other tests to confirm the diagnosis, including: Testing for allergies. The skin may be pricked with a tiny needle. The pricked area is then exposed to small amounts of allergens. Testing for  other eye conditions. Tests may include: Blood tests. Tissue scrapings from your eyelid. The tissue is then checked under a microscope. How is this treated? This condition may be treated with: Cold, wet cloths (cold compresses) to soothe itching and swelling. Washing the face to remove allergens. Eye drops. These may be prescription or over-the-counter. You may need to try different types to see which one works best for you, such as: Eye drops that block the allergic reaction (antihistamine). Eye drops that reduce swelling and irritation (anti-inflammatory). Steroid eye drops, which may be given if other treatments have not worked (vernal conjunctivitis). Oral antihistamine medicines. These are medicines taken by mouth to lessen your allergic reaction. You may need these if eye drops do not help or are difficult to use. Follow these instructions at  home: Eye care Apply a clean, cold compress to your eyes for 10-20 minutes, 3-4 times a day. Do not touch or rub your eyes. Do not wear contact lenses until the inflammation is gone. Wear glasses instead. Do not wear eye makeup until the inflammation is gone. General instructions Avoid known allergens whenever possible. Take or apply over-the-counter and prescription medicines only as told by your health care provider. These include any eye drops. Drink enough fluid to keep your urine pale yellow. Keep all follow-up visits as told by your health care provider. This is important. Contact a health care provider if: Your symptoms get worse or do not get better with treatment. You have mild eye pain. You become sensitive to light. You have spots or blisters on your eyes. You have pus draining from your eyes. You have a fever. Get help right away if: You have redness, swelling, or other symptoms in only one eye. Your vision is blurred or you have other vision changes. You have severe eye pain. Summary Allergic conjunctivitis is inflammation  of the clear membrane that covers the white part of the eye and the inner surface of the eyelid. Take or apply over-the-counter and prescription medicines only as told by your health care provider. These include eye drops. Do not touch or rub your eyes. Contact a health care provider if your symptoms get worse or do not get better with treatment. This information is not intended to replace advice given to you by your health care provider. Make sure you discuss any questions you have with your health care provider. Document Revised: 04/02/2019 Document Reviewed: 04/05/2019 Elsevier Patient Education  [place].

## 2021-11-13 NOTE — Progress Notes (Signed)
Subjective:    Patient ID: Susan Lawrence, female    DOB: [birth year], 41 y.o.   MRN: [id number]  41y/o established female pt c/o flare of facial rash around eyes that she has been seen for multiple times this year. Using aquaphor at night daily.   Also has rash across MCP joints of R hand itchy.  Was never contacted by specialty provider office to schedule appts.  Has noticed rash seems to worsen when she eats eggs so she has been decreasing intake.  Does not have any medicated ointment at home.  Avoiding steamy showers/baths. Last office visit 09/20/21 placed referral to rheumatology Dr Leta Baptist office     History of facial and hand rash:   Stated was seen by urgent care provider 09/02/21 on the weekend as eyes were matted shut/worsening eyelid rash and started on prednisone 40mg  x 4 days, mupirocin topical BID and doxycycline 100mg  po BID for blepharitis.  Eyelid rash still present and ran out of prednisone/took last dose this am.  Had some leftover tabs at home and took 30mg  this am from last Rx I dispensed to her from [facility].  Denied fever/chills/headache/dyspnea/wheezing.   Stated skin itchy but eyes are not itchy currently.  Denied red eyes or visual changes.   Using zyrtec 10mg  po BID prn itching. Aquaphor ointment to facial skin prn.  Mupirocin ointment prn also from UC provider.   Cucumber slices to eyelids prn.  Stopped calagel as "burning" sensation skin.  Denied new personal hygiene or laundry/cleaning products.  History of illness: 19 Jul 2021 facial rash cheeks thought due to mask wear/contact dermatitis at that time.  Patient also had flare of hand rash/atopic dermatitis at that time. Started on emollient and oral prednisone along with antihistamine for itching prn.  Rash improved until worked in yard flared again and seen in clinic again 07/31/21.  Next flare up 08/14/21 increased prednisone to 40mg , started of famotidine and atarax and allergy referral [facility] (patient has not scheduled)  3/30 patient  eyelid rash resolved and only a few cheek papules nonerythematous remaining and was finishing prednisone taper currently on 10mg  prednisone at time of reassessment.      Review of Systems  Constitutional:  Negative for activity change, appetite change, chills, diaphoresis, fatigue and fever.  HENT:  Negative for trouble swallowing and voice change.   Eyes:  Negative for photophobia and visual disturbance.  Respiratory:  Negative for cough, choking, shortness of breath, wheezing and stridor.   Cardiovascular:  Negative for chest pain.  Gastrointestinal:  Negative for diarrhea, nausea and vomiting.  Endocrine: Negative for cold intolerance and heat intolerance.  Genitourinary:  Negative for difficulty urinating.  Musculoskeletal:  Negative for gait problem, neck pain and neck stiffness.  Skin:  Positive for color change and rash. Negative for pallor and wound.  Allergic/Immunologic: Positive for environmental allergies.  Neurological:  Negative for dizziness, tremors, seizures, syncope, facial asymmetry, speech difficulty, weakness, light-headedness, numbness and headaches.  Psychiatric/Behavioral:  Negative for agitation, confusion and sleep disturbance.        Objective:   Physical Exam Vitals and nursing note reviewed.  Constitutional:      General: She is awake. She is not in acute distress.    Appearance: Normal appearance. She is well-developed, well-groomed and normal weight. She is not ill-appearing, toxic-appearing or diaphoretic.  HENT:     Head: Normocephalic and atraumatic. No raccoon eyes, Battle's sign, abrasion, contusion or laceration.     Jaw: There is normal jaw occlusion.  No tenderness, swelling, pain on movement or malocclusion.     Salivary Glands: Right salivary gland is not diffusely enlarged or tender. Left salivary gland is not diffusely enlarged or tender.     Right Ear: Hearing and external ear normal.     Left Ear: Hearing and external ear normal.      Nose: Nose normal. No congestion or rhinorrhea.     Mouth/Throat:     Lips: Pink. No lesions.     Mouth: Mucous membranes are moist. No oral lesions or angioedema.     Dentition: No gum lesions.     Tongue: No lesions. Tongue does not deviate from midline.     Palate: No mass and lesions.     Pharynx: Oropharynx is clear. Uvula midline. No uvula swelling.  Eyes:     General: Lids are normal. Vision grossly intact. Gaze aligned appropriately. Allergic shiner present. No scleral icterus.       Right eye: No discharge.        Left eye: No discharge.     Extraocular Movements: Extraocular movements intact.     Right eye: Normal extraocular motion and no nystagmus.     Left eye: Normal extraocular motion and no nystagmus.     Conjunctiva/sclera:     Right eye: Right conjunctiva is injected. No chemosis, exudate or hemorrhage.    Left eye: Left conjunctiva is injected. No chemosis, exudate or hemorrhage.    Pupils: Pupils are equal, round, and reactive to light.     Comments: Macular hyperpigmentation bilateral upper eyelids; dry skin mild erythema; no debris in eyelashes/eyebrows noted; injection bilateral upper and lower eyelids noted 2+/4  Neck:     Trachea: Trachea normal.  Cardiovascular:     Rate and Rhythm: Normal rate and regular rhythm.     Pulses: Normal pulses.          Radial pulses are 2+ on the right side and 2+ on the left side.     Heart sounds: Normal heart sounds, S1 normal and S2 normal.  Pulmonary:     Effort: Pulmonary effort is normal. No respiratory distress.     Breath sounds: Normal breath sounds and air entry. No stridor, decreased air movement or transmitted upper airway sounds. No wheezing.     Comments: Spoke full sentences without difficulty; no cough observed in exam room Abdominal:     General: Abdomen is flat.  Musculoskeletal:        General: Swelling and tenderness present. No signs of injury. Normal range of motion.     Right hand: Swelling and  tenderness present. No deformity or lacerations. Normal range of motion. Normal strength. Normal sensation. Normal capillary refill.     Left hand: No swelling, deformity, lacerations or tenderness. Normal range of motion. Normal strength. Normal sensation. Normal capillary refill.     Cervical back: Normal range of motion and neck supple. No swelling, edema, deformity, erythema, signs of trauma, lacerations, rigidity, tenderness or crepitus. Normal range of motion.     Thoracic back: No swelling, edema, deformity, signs of trauma or lacerations. Normal range of motion.     Right lower leg: No edema.     Left lower leg: No edema.  Lymphadenopathy:     Head:     Right side of head: No submandibular or preauricular adenopathy.     Left side of head: No submandibular or preauricular adenopathy.     Cervical: No cervical adenopathy.     Right cervical:  No superficial cervical adenopathy.    Left cervical: No superficial cervical adenopathy.  Skin:    General: Skin is warm and dry.     Capillary Refill: Capillary refill takes less than 2 seconds.     Coloration: Skin is not ashen, cyanotic, jaundiced, mottled, pale or sallow.     Findings: Abrasion, erythema and rash present. No abscess, acne, bruising, burn, ecchymosis, signs of injury, laceration, lesion, petechiae or wound. Rash is macular, papular and scaling. Rash is not crusting, nodular, purpuric, pustular or vesicular.     Nails: There is no clubbing.          Comments: Linear abrasions over right MCPs ointment noted over skin finely scabbed and grouped papules mild erythema nonpitting edema 0-1+/4 noted; bilateral upper and lower eyelids with fine scaling dry and mild erythema patient noted to occasionally rub eyes wearing large eyeglass frames to help obscure rash from coworkers; macular hyperpigmented upper eyelids dry nonpitting edema 0-1+/4  Neurological:     General: No focal deficit present.     Mental Status: She is alert and  oriented to person, place, and time. Mental status is at baseline.     GCS: GCS eye subscore is 4. GCS verbal subscore is 5. GCS motor subscore is 6.     Cranial Nerves: Cranial nerves 2-12 are intact. No cranial nerve deficit, dysarthria or facial asymmetry.     Sensory: Sensation is intact. No sensory deficit.     Motor: Motor function is intact. No weakness, tremor, atrophy, abnormal muscle tone or seizure activity.     Coordination: Coordination is intact. Coordination normal.     Gait: Gait is intact. Gait normal.     Comments: In/out of chair and on/off exam table without difficulty; gait sure and steady in clinic; bilateral hand grasp equal 5/5  Psychiatric:        Attention and Perception: Attention and perception normal.        Mood and Affect: Mood and affect normal.        Speech: Speech normal.        Behavior: Behavior normal. Behavior is cooperative.        Thought Content: Thought content normal.        Cognition and Memory: Cognition and memory normal.        Judgment: Judgment normal.           Assessment & Plan:   A-pruritic facial dermatitis recurrent, acute conjunctivitis bilateral unspecified type, rash of hand right subsequent visit   P-RN Rolly Salter to confirm referral received by Dr Leta Baptist office and allergist office [place] or if any further information needs to be faxed.  Dr Leta Baptist office required refaxing of referral/office note.  Allergist appt scheduled for fall/first available.  RN Rolly Salter will notify patient of appt date/time.  Patient did not have day of week/time preference other than to book first available.  New Rx prednisone 30mg  po daily with food x 4 days then decrease to 20mg  x 4 days then 10mg  x 4 days dispensed 42 tabs from [facility] to patient today  Rheumatology referral ambulatory to Dr office per patient preference network provider [facility] as suspect rheumatologic condition.  Patient reported until this winter she had never experienced facial rash  like this and initially thought to be contact dermatitis from mask wear/changing facial hygiene products.  Discussed with patient suspect dermatomyositis/sjogrens/lupus but will need further evaluation and testing by specialist.  Avoid sun exposure on face/wear hat/sunscreen as per up  to date can worsen rash if dermatomyositis.  Patient to follow up in 1 week for re-evaluation sooner if worsening of rash.  May continue aquaphor ointment, zyrtec 10mg  po BID prn itching.  Discussed frozen vegetables lighter than ice.  May use eye cool compresses also prn swelling/itching.  Avoid rubbing/scratching repetitive. Exitcare handouts on food egg allergy since patient has noticed symptoms worsen after she eats eggs.  Avoid egg intake until further consultation with allergist.  Patient verbalized understanding information/instructions, agreed with plan of care and had no further questions at this time.  Cleared for work discussed.  Patient to apply warm or cool packs prn right eye 5 minutes TID prn or warm compress.   Refresh drops 2 right eye TID x 7 days given 5 UD from clinic stock   Discussed blinking irritates eye blood vessels further as eyelid vessels rub on bulbar vessals. Instructed patient to not rub eyes. May need to wash pillowcases more frequently until infection resolves if discharge noted on pillow. May use over the counter eye drops/tears such as visine per manufacturer instructions for pain/symptom relief. Return to clinic if headache, fever greater than 100.9F, nausea/vomiting, purulent discharge/matting unable to open eye without using fingers after 24 hours of medication use, foreign body sensation, ciliary flush, worsening photophobia or vision. See optometrist same day if visual field loss, worsening light sensitivity, orbital swelling.  Call or return to clinic as needed if these symptoms worsen or fail to improve as anticipated. Exitcare handout on allergic conjunctivitis. Patient verbalized  agreement and understanding of treatment plan and had no further questions at this time.  P2: Hand washing  New Rx clobetasol 0.05% apply BID prn topical affected area right hand  #30 gms RF0 electronic Rx sent to her pharmacy of choice.  Currently on prednisone taper for possible rheumatologic condition pending rheumatology evaluation.  Apply clobetasol after bathing to rash right hand and cover with emollient e.g. vaseline or aquaphor.  Consider applying saran wrap over top when sleeping or wear cotton glove after application  Avoid soaking hands in water.  Wear gloves to wash dishes. Avoid scratching.  May use zyrtec 10mg  po BID prn itching OTC.  Follow up re-evaluation if new or worsening symptoms with plan of care.  Patient verbalized understanding information/instructions, agreed with plan of care and had no further questions at this time.

## 2021-12-18 ENCOUNTER — Telehealth: Payer: Self-pay | Admitting: Registered Nurse

## 2021-12-18 ENCOUNTER — Encounter: Payer: Self-pay | Admitting: Registered Nurse

## 2021-12-18 DIAGNOSIS — R7303 Prediabetes: Secondary | ICD-10-CM

## 2021-12-18 DIAGNOSIS — R21 Rash and other nonspecific skin eruption: Secondary | ICD-10-CM

## 2021-12-18 NOTE — Telephone Encounter (Signed)
Patient reported rash has resolved feeling well.  Has not had referral appt yet and wondering what they can do since rash is gone.  Discussed with patient I still recommend having specialist appt to discuss recurrent rash.   Received voicemail from [facility] today Patient scheduled with Dr Eber Jones in [place] office 01 Jan 814 needs address and phone number faxed  to [phone] or call [phone] [facility]  Will contact  office tomorrow with requested information as clinic currently closed when voicemail listened to.

## 2021-12-20 NOTE — Telephone Encounter (Signed)
Patient given appt date/time address/provider information and copy of insurance card emailed to [employer].  Patient verbalized understanding information/instructions and had no further questions at this time.

## 2022-01-14 NOTE — Telephone Encounter (Signed)
Patient reported she saw allergist and had skin testing performed and allergic to grasses and other plants.  No food testing performed per patient.  Discussed I cannot see office visit note in Epic or allergy testing results.  Patient reported she would bring them to clinic (her copy) for me to review.  She has not scheduled with rheumatology.  Facial rash has not reoccurred.  Was given refill topical treatment for hand rash by allergist.  Patient verbalized understanding information and had no further questions at this time.

## 2022-01-19 NOTE — Telephone Encounter (Signed)
Fax received from [facility] 01/17/22 but half way down page transmission error and only S portion of note readable.  Asked RN Stone to contact office to have office note refaxed.

## 2022-01-23 ENCOUNTER — Telehealth: Payer: Self-pay | Admitting: Registered Nurse

## 2022-01-23 NOTE — Telephone Encounter (Signed)
Patient brought her copies of allergy testing/office summary note to [facility].  Copy made and placed in her paper chart on site.  Discussed results with patient.  Food testing was conducted but negative.  Tree and grass positive test results noted.  Patient stated her hand rash has been good lately.  Hasn't needed prn topical steroid.  Has not had reoccurrence of eye rash since last prednisone taper 11/13/21.  Patient verbalized understanding information and had no further questions at this time.

## 2022-02-06 ENCOUNTER — Encounter: Payer: Self-pay | Admitting: Registered Nurse

## 2022-05-01 ENCOUNTER — Other Ambulatory Visit: Payer: Self-pay | Admitting: Occupational Medicine

## 2022-05-01 DIAGNOSIS — R7303 Prediabetes: Secondary | ICD-10-CM

## 2022-05-01 NOTE — Progress Notes (Signed)
Lab drawn from Left AC tolerated well no issues noted.   

## 2022-05-01 NOTE — Addendum Note (Signed)
Addended by: Sissy Hoff M on: 05/01/2022 03:36 PM   Modules accepted: Orders

## 2022-05-02 LAB — HGB A1C W/O EAG: Hgb A1c MFr Bld: 5.6 % (ref 4.8–5.6)

## 2022-07-09 ENCOUNTER — Telehealth: Payer: Self-pay | Admitting: Registered Nurse

## 2022-07-09 ENCOUNTER — Encounter: Payer: Self-pay | Admitting: Registered Nurse

## 2022-07-09 DIAGNOSIS — Z0000 Encounter for general adult medical examination without abnormal findings: Secondary | ICD-10-CM

## 2022-07-09 NOTE — Telephone Encounter (Signed)
Last exec panel Mar 2023.  Needs to be scheduled for Be Well appt with RN [facility] March 2024.  Exec panel with Hgba1c orders placed for patient.

## 2022-07-12 NOTE — Telephone Encounter (Signed)
Patient notified to schedule labs fasting with RN Evlyn Kanner she verbalized understanding information/instructions and had no further questions at this time.

## 2022-08-08 ENCOUNTER — Other Ambulatory Visit: Payer: Self-pay | Admitting: Occupational Medicine

## 2022-08-08 DIAGNOSIS — Z0000 Encounter for general adult medical examination without abnormal findings: Secondary | ICD-10-CM

## 2022-08-08 NOTE — Progress Notes (Signed)
Lab drawn from Right AC tolerated well no issues noted.   

## 2022-08-09 ENCOUNTER — Other Ambulatory Visit: Payer: Self-pay | Admitting: Registered Nurse

## 2022-08-09 ENCOUNTER — Encounter: Payer: Self-pay | Admitting: Registered Nurse

## 2022-08-09 DIAGNOSIS — R7303 Prediabetes: Secondary | ICD-10-CM

## 2022-08-09 LAB — CMP12+LP+TP+TSH+6AC+CBC/D/PLT
ALT: 17 IU/L (ref 0–32)
AST: 25 IU/L (ref 0–40)
Albumin/Globulin Ratio: 1.6 (ref 1.2–2.2)
Albumin: 4.7 g/dL (ref 3.9–4.9)
Alkaline Phosphatase: 51 IU/L (ref 44–121)
BUN/Creatinine Ratio: 12 (ref 9–23)
BUN: 10 mg/dL (ref 6–24)
Basophils Absolute: 0.1 10*3/uL (ref 0.0–0.2)
Basos: 1 %
Bilirubin Total: 0.4 mg/dL (ref 0.0–1.2)
Calcium: 9.3 mg/dL (ref 8.7–10.2)
Chloride: 105 mmol/L (ref 96–106)
Chol/HDL Ratio: 4 ratio (ref 0.0–4.4)
Cholesterol, Total: 163 mg/dL (ref 100–199)
Creatinine, Ser: 0.81 mg/dL (ref 0.57–1.00)
EOS (ABSOLUTE): 0.2 10*3/uL (ref 0.0–0.4)
Eos: 3 %
Estimated CHD Risk: 0.8 times avg. (ref 0.0–1.0)
Free Thyroxine Index: 2.6 (ref 1.2–4.9)
GGT: 13 IU/L (ref 0–60)
Globulin, Total: 3 g/dL (ref 1.5–4.5)
Glucose: 75 mg/dL (ref 70–99)
HDL: 41 mg/dL (ref >39)
Hematocrit: 43.5 % (ref 34.0–46.6)
Hemoglobin: 13.5 g/dL (ref 11.1–15.9)
Immature Grans (Abs): 0 10*3/uL (ref 0.0–0.1)
Immature Granulocytes: 0 %
Iron: 141 ug/dL (ref 27–159)
LDH: 200 IU/L (ref 119–226)
LDL Chol Calc (NIH): 94 mg/dL (ref 0–99)
Lymphocytes Absolute: 1.8 10*3/uL (ref 0.7–3.1)
Lymphs: 29 %
MCH: 27.1 pg (ref 26.6–33.0)
MCHC: 31 g/dL — ABNORMAL LOW (ref 31.5–35.7)
MCV: 87 fL (ref 79–97)
Monocytes Absolute: 0.4 10*3/uL (ref 0.1–0.9)
Monocytes: 6 %
Neutrophils Absolute: 3.8 10*3/uL (ref 1.4–7.0)
Neutrophils: 61 %
Phosphorus: 3.8 mg/dL (ref 3.0–4.3)
Platelets: 236 10*3/uL (ref 150–450)
Potassium: 4 mmol/L (ref 3.5–5.2)
RBC: 4.99 x10E6/uL (ref 3.77–5.28)
RDW: 13.3 % (ref 11.7–15.4)
Sodium: 142 mmol/L (ref 134–144)
T3 Uptake Ratio: 33 % (ref 24–39)
T4, Total: 7.9 ug/dL (ref 4.5–12.0)
TSH: 0.999 u[IU]/mL (ref 0.450–4.500)
Total Protein: 7.7 g/dL (ref 6.0–8.5)
Triglycerides: 160 mg/dL — ABNORMAL HIGH (ref 0–149)
Uric Acid: 6.1 mg/dL (ref 2.6–6.2)
VLDL Cholesterol Cal: 28 mg/dL (ref 5–40)
WBC: 6.3 10*3/uL (ref 3.4–10.8)
eGFR: 93 mL/min/{1.73_m2} (ref >59)

## 2022-08-09 LAB — HEMOGLOBIN A1C
Est. average glucose Bld gHb Est-mCnc: 120 mg/dL
Hgb A1c MFr Bld: 5.8 % — ABNORMAL HIGH (ref 4.8–5.6)

## 2022-08-09 NOTE — Progress Notes (Signed)
My chart message sent to patient Susan Lawrence, Your 3 month blood sugar worsening (Hgba1c) and MCHC slightly low but otherwise labs normal repeat in 1 year fasting. I recommend increasing water and fiber (whole grains/fruits and vegetables) in diet and decreasing processed foods/added sugars. Keep added sugars less than 25 grams per day 5 teaspoons; activity 150 minutes per week; avoid dehydration and drink water to keep urine pale yellow clear and voiding every 2-4 hours while awake. Link to dietitian to book free appt [facility] ([email or web address]). Recheck level in 3 months nonfasting Hgba1c schedule with RN Kimrey.  Exitcare handouts on prediabetes diet/prediabetes sent to my chart.  Cholesterol, electrolytes, iron, kidney/liver/thyroid function normal and no infection or anemia noted on complete blood count. Please let us know if you have further questions or concerns. You met Be Well 2025 requirements for insurance discount see RN Evlyn Kanner to sign your paperwork.  Sincerely, Gerarda Fraction NP-C

## 2022-08-12 ENCOUNTER — Ambulatory Visit: Payer: Self-pay | Admitting: Occupational Medicine

## 2022-08-12 DIAGNOSIS — R7303 Prediabetes: Secondary | ICD-10-CM

## 2022-08-12 DIAGNOSIS — Z0000 Encounter for general adult medical examination without abnormal findings: Secondary | ICD-10-CM

## 2022-08-12 NOTE — Progress Notes (Signed)
Your 3 month blood sugar worsening (Hgba1c) and MCHC slightly low but otherwise labs normal repeat in 1 year fasting. I recommend increasing water and fiber (whole grains/fruits and vegetables) in diet and decreasing processed foods/added sugars.  Keep added sugars  less than 25 grams per day 5 teaspoons; activity 150 minutes per week; avoid dehydration and drink water to keep urine pale yellow clear and voiding every 2-4 hours while awake.  Link to dietitian to book free appt [facility] ([email or web address]). emailed link to the patient.  Recheck level in 3 months nonfasting Hgba1c scheduled. Exitcare handouts on prediabetes diet/prediabetes sent to my chart.  Cholesterol, electrolytes, iron, kidney/liver/thyroid function normal and no infection or anemia noted on complete blood count.  Please let us know if you have further questions or concerns.      Be well insurance premium discount evaluation:    Epic reviewed by RN Evlyn Kanner and transcribed. Labs  Tobacco attestation signed. [facility] ROI formed signed. Forms placed in the chart.   Patient given handouts for [facility] pharmacies and discount drugs list,MyChart, Tele doc setup, Tele doc Behavioral, [place] counseling and [facility] counseling.  What to do for infectious illness protocol. Given handout for list of medications that can be filled at [facility]. Given Clinic hours and Clinic Email.

## 2022-08-24 NOTE — Telephone Encounter (Signed)
Patient met requirements for insurance discount see results note.  Be Well paperwork signed and RN Kimrey notified HR and patient.

## 2022-11-10 NOTE — Progress Notes (Signed)
Noted patient has had follow up labs 

## 2022-11-13 ENCOUNTER — Other Ambulatory Visit: Payer: Self-pay | Admitting: Occupational Medicine

## 2022-11-13 DIAGNOSIS — R7303 Prediabetes: Secondary | ICD-10-CM

## 2022-11-13 NOTE — Progress Notes (Signed)
Lab drawn from Left AC tolerated well no issues noted.   

## 2022-11-14 ENCOUNTER — Encounter: Payer: Self-pay | Admitting: Registered Nurse

## 2022-11-14 LAB — HEMOGLOBIN A1C
Est. average glucose Bld gHb Est-mCnc: 117 mg/dL
Hgb A1c MFr Bld: 5.7 % — ABNORMAL HIGH (ref 4.8–5.6)

## 2023-01-28 ENCOUNTER — Telehealth: Payer: Self-pay | Admitting: General Practice

## 2023-01-28 NOTE — Telephone Encounter (Signed)
Not our patient, unable to complete form. Form returned to front desk for contact of pt.

## 2023-01-28 NOTE — Telephone Encounter (Signed)
Pt dropped out paperwork that needs to be completed by her provider and faxed as well. Pt will pick up forms once completed.

## 2023-01-29 NOTE — Telephone Encounter (Signed)
Patient coming in on 9/11 @ 920 for first appointment.  Will discuss at that time paperwork completion.

## 2023-02-05 ENCOUNTER — Ambulatory Visit: Payer: No Typology Code available for payment source | Admitting: Family Medicine

## 2023-02-05 ENCOUNTER — Encounter: Payer: Self-pay | Admitting: Family Medicine

## 2023-02-05 VITALS — BP 116/80 | HR 65 | Temp 98.3°F | Ht 64.0 in | Wt 125.0 lb

## 2023-02-05 DIAGNOSIS — R7303 Prediabetes: Secondary | ICD-10-CM

## 2023-02-05 DIAGNOSIS — Z7689 Persons encountering health services in other specified circumstances: Secondary | ICD-10-CM

## 2023-02-05 NOTE — Progress Notes (Signed)
New Patient Office Visit  Subjective    Patient ID: Susan Lawrence, female    DOB: 1980/11/29  Age: 42 y.o. MRN: [id number]  CC:  Chief Complaint  Patient presents with   Establish Care    Form completion     HPI Susan Lawrence presents to establish care  No PCP in 2 years.   Prediabetes- last A1c 5.7% in June 2024 Improving diet.   Works at [employer].  Married 43 year old son.   She has FMLA forms with her today.     Outpatient Encounter Medications as of 02/05/2023  Medication Sig   cetirizine (ZYRTEC) 10 MG tablet Take 1 tablet (10 mg total) by mouth 2 (two) times daily as needed for allergies. (Patient not taking: Reported on 02/05/2023)   [DISCONTINUED] clobetasol cream (TEMOVATE) 0.05 % Apply 1 Application topically 2 (two) times daily.   [DISCONTINUED] doxycycline (VIBRAMYCIN) 100 MG capsule Take 100 mg by mouth 2 (two) times daily.   [DISCONTINUED] hydrOXYzine (VISTARIL) 25 MG capsule Take 1 capsule (25 mg total) by mouth every 8 (eight) hours as needed.   [DISCONTINUED] mineral oil-hydrophilic petrolatum (AQUAPHOR) ointment Apply topically as needed for dry skin.   [DISCONTINUED] Multiple Vitamin (MULTIVITAMIN) capsule Take 1 capsule by mouth daily.   [DISCONTINUED] mupirocin ointment (BACTROBAN) 2 % Apply 1 application. topically 2 (two) times daily.   [DISCONTINUED] VITAMIN D PO Take 1 tablet by mouth daily.   No facility-administered encounter medications on file as of 02/05/2023.    Past Medical History:  Diagnosis Date   Anemia    Medical history non-contributory     Past Surgical History:  Procedure Laterality Date   BREAST SURGERY     Benign breast lesion    Family History  Problem Relation Age of Onset   Diabetes Father    Heart disease Father     Social History   Socioeconomic History   Marital status: Married    Spouse name: Not on file   Number of children: Not on file   Years of education: Not on file   Highest education  level: Not on file  Occupational History   Not on file  Tobacco Use   Smoking status: Never   Smokeless tobacco: Never  Vaping Use   Vaping status: Never Used  Substance and Sexual Activity   Alcohol use: No   Drug use: No   Sexual activity: Not Currently    Birth control/protection: None  Other Topics Concern   Not on file  Social History Narrative   Not on file   Social Determinants of Health   Financial Resource Strain: Not on file  Food Insecurity: Not on file  Transportation Needs: Not on file  Physical Activity: Not on file  Stress: Not on file  Social Connections: Unknown (10/09/2021)   Received from [facility]   Social Network    Social Network: Not on file  Intimate Partner Violence: Unknown (08/31/2021)   Received from [facility]   HITS    Physically Hurt: Not on file    Insult or Talk Down To: Not on file    Threaten Physical Harm: Not on file    Scream or Curse: Not on file    Review of Systems  Constitutional:  Negative for chills and fever.  Respiratory:  Negative for shortness of breath.   Cardiovascular:  Negative for chest pain, palpitations and leg swelling.  Gastrointestinal:  Negative for abdominal pain, constipation, diarrhea, nausea and vomiting.  Genitourinary:  Negative for dysuria, frequency and urgency.  Neurological:  Negative for dizziness, focal weakness and headaches.  Psychiatric/Behavioral:  Negative for depression. The patient is not nervous/anxious.         Objective    BP 116/80 (BP Location: Right Arm, Patient Position: Sitting, Cuff Size: Normal)   Pulse 65   Temp 98.3 F (36.8 C) (Oral)   Ht 5\' 4"  (1.626 m)   Wt 125 lb (56.7 kg)   SpO2 100%   BMI 21.46 kg/m   Physical Exam Constitutional:      General: She is not in acute distress.    Appearance: She is not ill-appearing.  Eyes:     Extraocular Movements: Extraocular movements intact.     Conjunctiva/sclera: Conjunctivae normal.  Cardiovascular:     Rate  and Rhythm: Normal rate and regular rhythm.  Pulmonary:     Effort: Pulmonary effort is normal.     Breath sounds: Normal breath sounds.  Musculoskeletal:     Cervical back: Normal range of motion and neck supple.  Skin:    General: Skin is warm and dry.  Neurological:     General: No focal deficit present.     Mental Status: She is alert and oriented to person, place, and time.  Psychiatric:        Mood and Affect: Mood normal.        Behavior: Behavior normal.        Thought Content: Thought content normal.         Assessment & Plan:   Problem List Items Addressed This Visit   None Visit Diagnoses     Prediabetes    -  Primary   Encounter to establish care          She is here to establish care.  Reviewed EMR and labs.  Discussed that FMLA paperwork is inappropriate for what she is requesting which is to have more time to assist her husband and get her child from school. Her husband is with her and is working, driving and able to care for himself.  She is ok with this decision.   Return if symptoms worsen or fail to improve.   Hetty Blend, NP-C

## 2023-06-03 ENCOUNTER — Ambulatory Visit (INDEPENDENT_AMBULATORY_CARE_PROVIDER_SITE_OTHER): Payer: No Typology Code available for payment source | Admitting: Nurse Practitioner

## 2023-06-03 ENCOUNTER — Other Ambulatory Visit (HOSPITAL_COMMUNITY)
Admission: RE | Admit: 2023-06-03 | Discharge: 2023-06-03 | Disposition: A | Discharge status: Home / Self Care | Payer: No Typology Code available for payment source | Source: Ambulatory Visit | Attending: Nurse Practitioner | Admitting: Nurse Practitioner

## 2023-06-03 VITALS — BP 106/62 | HR 68 | Ht 64.0 in | Wt 125.0 lb

## 2023-06-03 DIAGNOSIS — Z124 Encounter for screening for malignant neoplasm of cervix: Secondary | ICD-10-CM | POA: Diagnosis present

## 2023-06-03 DIAGNOSIS — Z01419 Encounter for gynecological examination (general) (routine) without abnormal findings: Secondary | ICD-10-CM

## 2023-06-03 NOTE — Progress Notes (Signed)
   Susan Lawrence 25-Sep-1980 [id number]   History:  43 y.o. G1P1001 presents as newly establish patient for annual exam without GYN complaints. Last seen in our office in 2021. Monthly cycles. Normal pap history. Pre-diabetic.   Gynecologic History Patient's last menstrual period was 05/14/2023. Period Cycle (Days): 28 Period Duration (Days): 3 Period Pattern: Regular Menstrual Flow: Moderate Menstrual Control: Maxi pad Menstrual Control Change Freq (Hours): 4-6 Dysmenorrhea: (!) Mild Dysmenorrhea Symptoms: Headache Contraception: none Sexually active : Yes  Health maintenance Last Pap: 03/03/2018. Results were: Normal neg HPV Last mammogram: 06/18/2021. Results were: Normal Last colonoscopy: Not indicated Last Dexa: Not indicated  Past medical history, past surgical history, family history and social history were all reviewed and documented in the EPIC chart. Married. Works at [employer]. 8 yo daughter.   ROS:  A ROS was performed and pertinent positives and negatives are included.  Exam:  Vitals:   06/03/23 0852  BP: 106/62  Pulse: 68  SpO2: 100%  Weight: 125 lb (56.7 kg)  Height: 5' 4 (1.626 m)    Body mass index is 21.46 kg/m.  General appearance:  Normal Thyroid:  Symmetrical, normal in size, without palpable masses or nodularity. Respiratory  Auscultation:  Clear without wheezing or rhonchi Cardiovascular  Auscultation:  Regular rate, without rubs, murmurs or gallops  Edema/varicosities:  Not grossly evident Abdominal  Soft,nontender, without masses, guarding or rebound.  Liver/spleen:  No organomegaly noted  Hernia:  None appreciated  Skin  Inspection:  Grossly normal Breasts: Examined lying and sitting.   Right: Without masses, retractions, nipple discharge or axillary adenopathy.   Left: Without masses, retractions, nipple discharge or axillary adenopathy. Pelvic: External genitalia:  no lesions              Urethra:  normal appearing urethra  with no masses, tenderness or lesions              Bartholins and Skenes: normal                 Vagina: normal appearing vagina with normal color and discharge, no lesions              Cervix: no lesions Bimanual Exam:  Uterus:  no masses or tenderness              Adnexa: no mass, fullness, tenderness              Rectovaginal: Deferred              Anus:  normal, no lesions  Patient informed chaperone available to be present for breast and pelvic exam. Patient has requested no chaperone to be present. Patient has been advised what will be completed during breast and pelvic exam.   Assessment/Plan:  43 y.o. G1P1001 for annual exam.   Well female exam with routine gynecological exam - Education provided on SBEs, importance of preventative screenings, current guidelines, high calcium diet, regular exercise, and multivitamin daily. Labs through employee health at work.   Cervical cancer screening - Plan: Cytology - PAP( [facility]). Normal pap history.   Screening for breast cancer - Normal mammogram history. Overdue and encouraged to schedule. Thinks she can get through work but would also like information on TBC just in case. Normal breast exam today.   Return in about 1 year (around 06/02/2024) for Annual.      Susan Lawrence Susan Lawrence Susan Lawrence, 9:17 AM 06/03/2023

## 2023-06-04 LAB — CYTOLOGY - PAP
Comment: NEGATIVE
Diagnosis: NEGATIVE
High risk HPV: NEGATIVE

## 2023-06-19 ENCOUNTER — Other Ambulatory Visit: Payer: Self-pay | Admitting: Nurse Practitioner

## 2023-06-19 DIAGNOSIS — Z1231 Encounter for screening mammogram for malignant neoplasm of breast: Secondary | ICD-10-CM

## 2023-06-24 ENCOUNTER — Ambulatory Visit
Admission: RE | Admit: 2023-06-24 | Discharge: 2023-06-24 | Disposition: A | Discharge status: Home / Self Care | Payer: No Typology Code available for payment source | Source: Ambulatory Visit | Attending: Nurse Practitioner | Admitting: Nurse Practitioner

## 2023-06-24 DIAGNOSIS — Z1231 Encounter for screening mammogram for malignant neoplasm of breast: Secondary | ICD-10-CM

## 2023-06-27 IMAGING — MG MM DIGITAL SCREENING BILAT W/ TOMO AND CAD
6 of 10 series · 6 of 30 positions shown · non-contrast
Comparison: Previous exam(s).

CLINICAL DATA: Screening.

EXAM:
DIGITAL SCREENING BILATERAL MAMMOGRAM WITH TOMOSYNTHESIS AND CAD
TECHNIQUE: Bilateral screening digital craniocaudal and mediolateral oblique
mammograms were obtained. Bilateral screening digital breast
tomosynthesis was performed. The images were evaluated with
computer-aided detection.

[L MLO synth-2D]
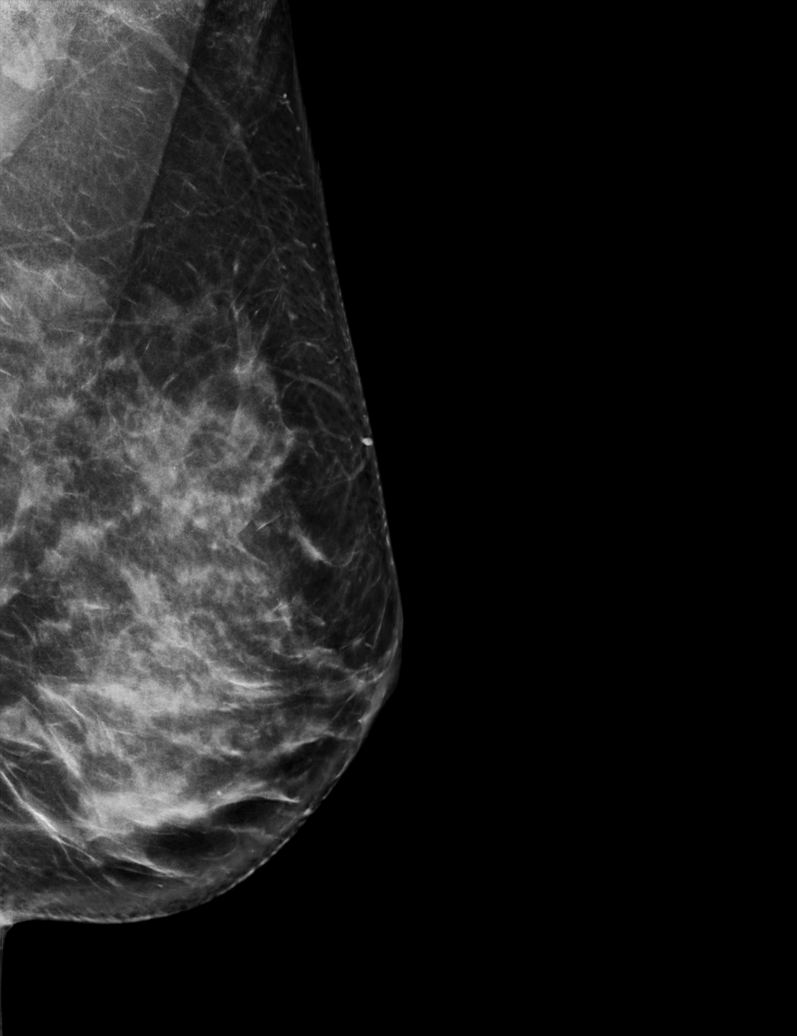

[R XCCL synth-2D]
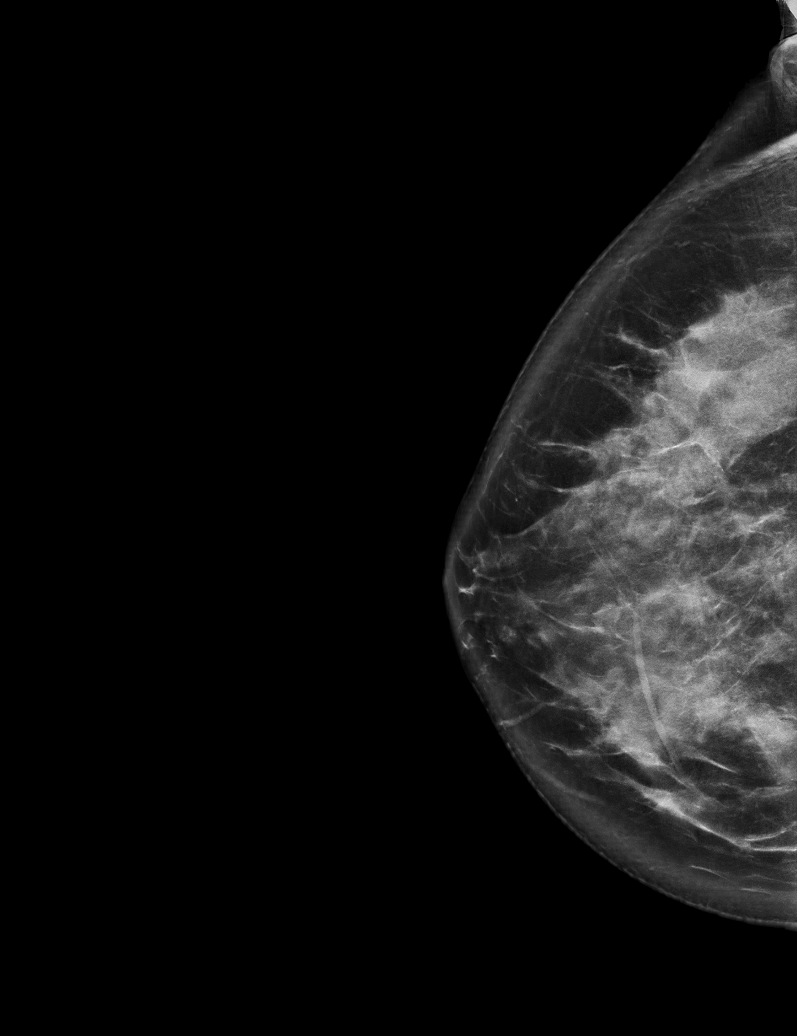

[R MLO synth-2D]
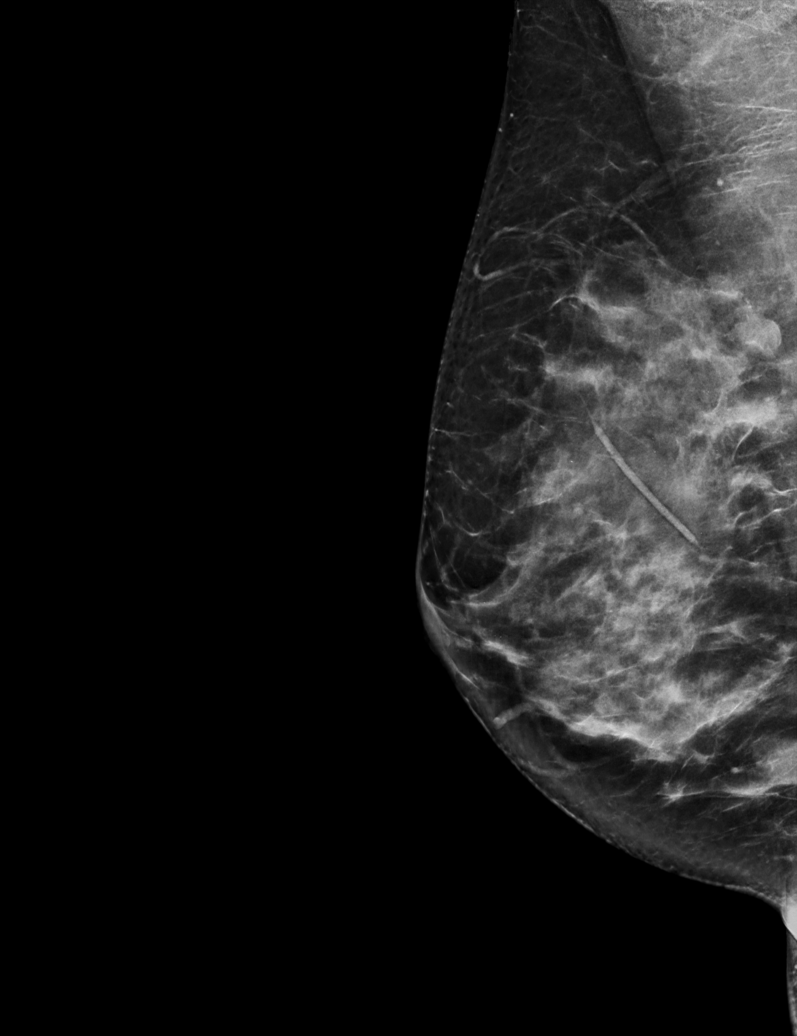

[R CC synth-2D]
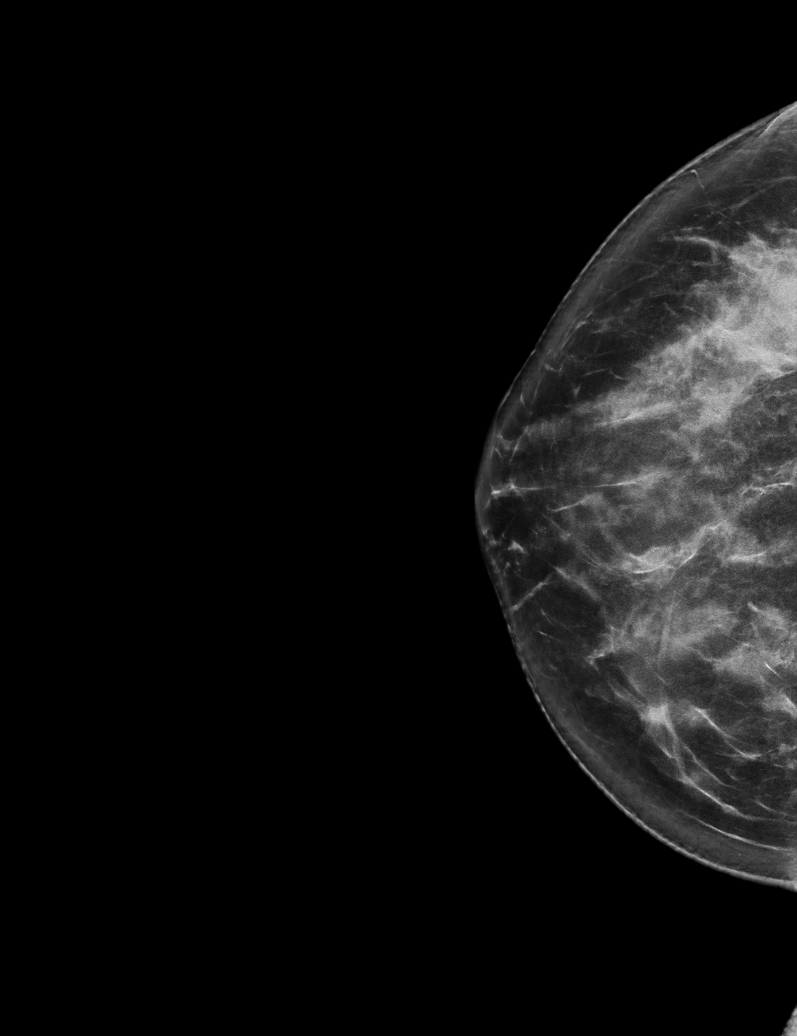

[L CC synth-2D]
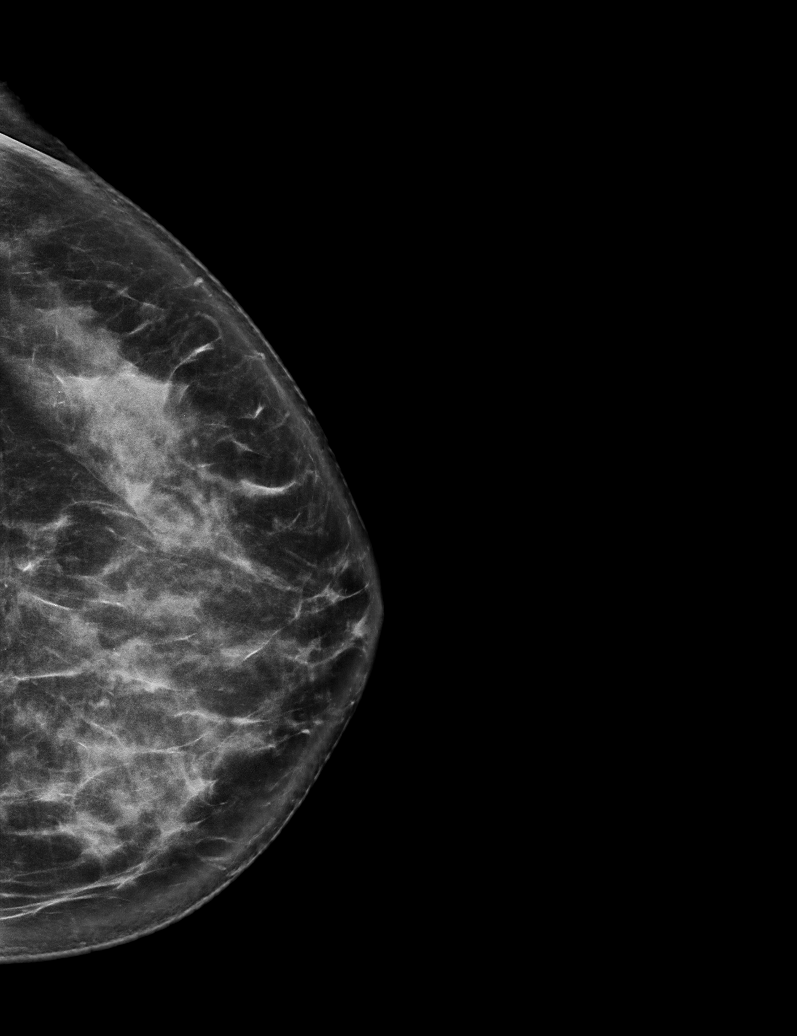

[L MLO tomo · tomo slice 37/74.0]
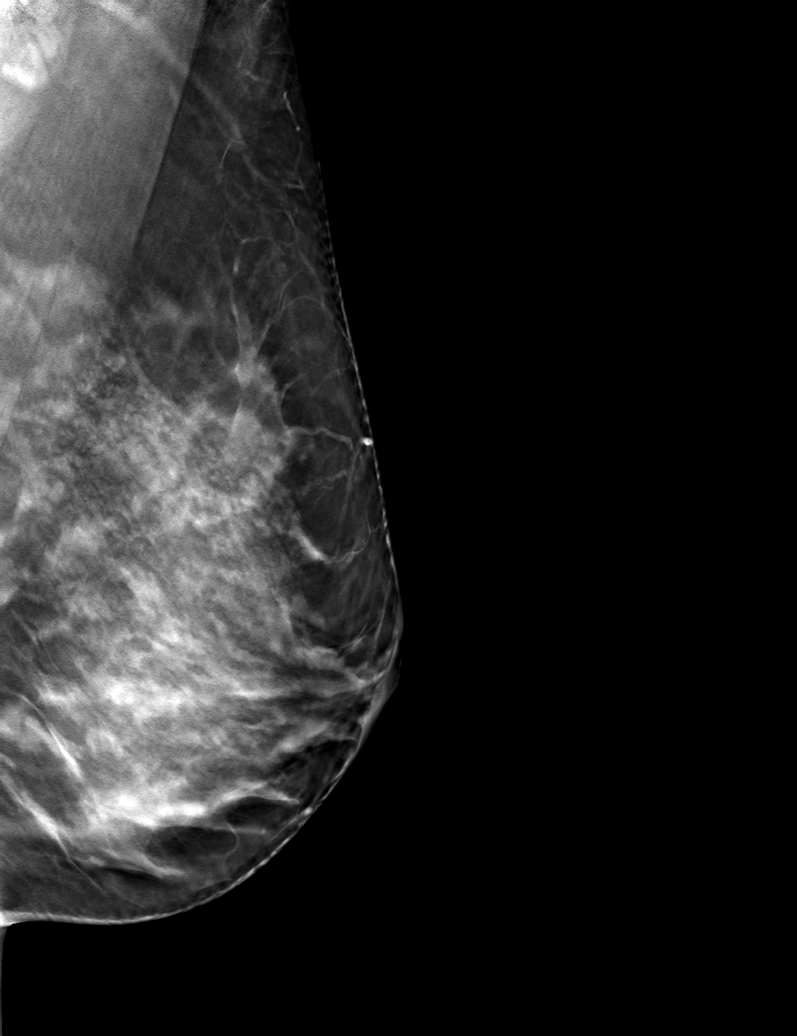

[6 of 30 positions shown; findings below may reference images not displayed]

ACR Breast Density Category d: The breast tissue is extremely dense,
which lowers the sensitivity of mammography
FINDINGS: There are no findings suspicious for malignancy.
IMPRESSION: No mammographic evidence of malignancy. A result letter of this
screening mammogram will be mailed directly to the patient.

RECOMMENDATION:
Screening mammogram in one year. (Code:TA-[id number])

BI-RADS CATEGORY  1: Negative.

## 2023-11-17 ENCOUNTER — Other Ambulatory Visit

## 2023-11-17 VITALS — BP 120/83 | Ht 63.75 in | Wt 125.0 lb

## 2023-11-17 DIAGNOSIS — Z0000 Encounter for general adult medical examination without abnormal findings: Secondary | ICD-10-CM

## 2023-11-17 NOTE — Progress Notes (Signed)
 Be Well labs.

## 2023-11-18 LAB — CMP12+LP+TP+TSH+6AC+CBC/D/PLT
ALT: 13 IU/L (ref 0–32)
AST: 22 IU/L (ref 0–40)
Albumin: 4.5 g/dL (ref 3.9–4.9)
Alkaline Phosphatase: 50 IU/L (ref 44–121)
BUN/Creatinine Ratio: 12 (ref 9–23)
BUN: 9 mg/dL (ref 6–24)
Basophils Absolute: 0 10*3/uL (ref 0.0–0.2)
Basos: 1 %
Bilirubin Total: 0.5 mg/dL (ref 0.0–1.2)
Calcium: 9.4 mg/dL (ref 8.7–10.2)
Chloride: 103 mmol/L (ref 96–106)
Chol/HDL Ratio: 4.4 ratio (ref 0.0–4.4)
Cholesterol, Total: 188 mg/dL (ref 100–199)
Creatinine, Ser: 0.75 mg/dL (ref 0.57–1.00)
EOS (ABSOLUTE): 0.2 10*3/uL (ref 0.0–0.4)
Eos: 3 %
Estimated CHD Risk: 1 times avg. (ref 0.0–1.0)
Free Thyroxine Index: 1.8 (ref 1.2–4.9)
GGT: 9 IU/L (ref 0–60)
Globulin, Total: 2.8 g/dL (ref 1.5–4.5)
Glucose: 90 mg/dL (ref 70–99)
HDL: 43 mg/dL (ref >39)
Hematocrit: 42.1 % (ref 34.0–46.6)
Hemoglobin: 13 g/dL (ref 11.1–15.9)
Immature Grans (Abs): 0 10*3/uL (ref 0.0–0.1)
Immature Granulocytes: 0 %
Iron: 84 ug/dL (ref 27–159)
LDH: 178 IU/L (ref 119–226)
LDL Chol Calc (NIH): 117 mg/dL — ABNORMAL HIGH (ref 0–99)
Lymphocytes Absolute: 1.7 10*3/uL (ref 0.7–3.1)
Lymphs: 28 %
MCH: 28.2 pg (ref 26.6–33.0)
MCHC: 30.9 g/dL — ABNORMAL LOW (ref 31.5–35.7)
MCV: 91 fL (ref 79–97)
Monocytes Absolute: 0.4 10*3/uL (ref 0.1–0.9)
Monocytes: 7 %
Neutrophils Absolute: 3.8 10*3/uL (ref 1.4–7.0)
Neutrophils: 61 %
Phosphorus: 3.1 mg/dL (ref 3.0–4.3)
Platelets: 207 10*3/uL (ref 150–450)
Potassium: 4.2 mmol/L (ref 3.5–5.2)
RBC: 4.61 x10E6/uL (ref 3.77–5.28)
RDW: 13.1 % (ref 11.7–15.4)
Sodium: 139 mmol/L (ref 134–144)
T3 Uptake Ratio: 30 % (ref 24–39)
T4, Total: 6 ug/dL (ref 4.5–12.0)
TSH: 1.67 u[IU]/mL (ref 0.450–4.500)
Total Protein: 7.3 g/dL (ref 6.0–8.5)
Triglycerides: 159 mg/dL — ABNORMAL HIGH (ref 0–149)
Uric Acid: 5.5 mg/dL (ref 2.6–6.2)
VLDL Cholesterol Cal: 28 mg/dL (ref 5–40)
WBC: 6.2 10*3/uL (ref 3.4–10.8)
eGFR: 101 mL/min/{1.73_m2} (ref >59)

## 2023-11-18 LAB — HEMOGLOBIN A1C
Est. average glucose Bld gHb Est-mCnc: 120 mg/dL
Hgb A1c MFr Bld: 5.8 % — ABNORMAL HIGH (ref 4.8–5.6)

## 2023-11-19 NOTE — Telephone Encounter (Signed)
 Patient had Be Well 2026 labs drawn with RN Nathanel; met requirements for insurance discount starting 25 Feb 2024.  RN Nathanel to complete [facility] form as NP on vacation through 01 Dec 2023 and PA Cheryln to notify patient of results via my chart.   Latest Reference Range & Units 11/17/23 08:50  Sodium 134 - 144 mmol/L 139  Potassium 3.5 - 5.2 mmol/L 4.2  Chloride 96 - 106 mmol/L 103  Glucose 70 - 99 mg/dL 90  BUN 6 - 24 mg/dL 9  Creatinine 9.42 - 8.99 mg/dL 9.24  Calcium 8.7 - 89.7 mg/dL 9.4  BUN/Creatinine Ratio 9 - 23  12  eGFR >59 mL/min/1.73 101  Phosphorus 3.0 - 4.3 mg/dL 3.1  Alkaline Phosphatase 44 - 121 IU/L 50  Albumin 3.9 - 4.9 g/dL 4.5  Uric Acid 2.6 - 6.2 mg/dL 5.5  AST 0 - 40 IU/L 22  ALT 0 - 32 IU/L 13  Total Protein 6.0 - 8.5 g/dL 7.3  Total Bilirubin 0.0 - 1.2 mg/dL 0.5  GGT 0 - 60 IU/L 9  Estimated CHD Risk 0.0 - 1.0 times avg. 1.0  LDH 119 - 226 IU/L 178  Total CHOL/HDL Ratio 0.0 - 4.4 ratio 4.4  Cholesterol, Total 100 - 199 mg/dL 811  HDL Cholesterol >60 mg/dL 43  Triglycerides 0 - 850 mg/dL 840 (H)  VLDL Cholesterol Cal 5 - 40 mg/dL 28  LDL Chol Calc (NIH) 0 - 99 mg/dL 882 (H)  Iron 27 - 840 ug/dL 84  Globulin, Total 1.5 - 4.5 g/dL 2.8  WBC 3.4 - 89.1 [id number] 6.2  RBC 3.77 - 5.28 x10E6/uL 4.61  Hemoglobin 11.1 - 15.9 g/dL 86.9  HCT 65.9 - 53.3 % 42.1  MCV 79 - 97 fL 91  MCH 26.6 - 33.0 pg 28.2  MCHC 31.5 - 35.7 g/dL 69.0 (L)  RDW 88.2 - 84.5 % 13.1  Platelets 150 - 450 x10E3/uL 207  Neutrophils Not Estab. % 61  Immature Granulocytes Not Estab. % 0  NEUT# 1.4 - 7.0 x10E3/uL 3.8  Lymphs Abs 0.7 - 3.1 x10E3/uL 1.7  Monocytes Absolute 0.1 - 0.9 x10E3/uL 0.4  Basophils Absolute 0.0 - 0.2 x10E3/uL 0.0  Immature Grans (Abs) 0.0 - 0.1 x10E3/uL 0.0  Lymphs Not Estab. % 28  Monocytes Not Estab. % 7  Basos Not Estab. % 1  Eos Not Estab. % 3  EOS (ABSOLUTE) 0.0 - 0.4 x10E3/uL 0.2  Hemoglobin A1C 4.8 - 5.6 % 5.8 (H)  Est. average glucose Bld gHb Est-mCnc mg/dL 879   TSH 9.549 - 5.499 uIU/mL 1.670  Thyroxine (T4) 4.5 - 12.0 ug/dL 6.0  Free Thyroxine Index 1.2 - 4.9  1.8  T3 Uptake Ratio 24 - 39 % 30  (H): Data is abnormally high (L): Data is abnormally low

## 2023-11-25 ENCOUNTER — Ambulatory Visit: Payer: Self-pay | Admitting: Registered Nurse

## 2023-11-25 DIAGNOSIS — R7303 Prediabetes: Secondary | ICD-10-CM

## 2023-12-31 NOTE — Telephone Encounter (Signed)
 [facility] form was completed by RN Nathanel per HR Jen.

## 2024-07-26 ENCOUNTER — Ambulatory Visit: Admitting: Nurse Practitioner

## 2024-07-30 ENCOUNTER — Ambulatory Visit: Admitting: Nurse Practitioner
